# Patient Record
Sex: Female | Born: 1985
Health system: Southern US, Community
[De-identification: ages and names within clinical notes are randomized; demographics above are authoritative.]

## PROBLEM LIST (undated history)

## (undated) DIAGNOSIS — R7303 Prediabetes: Secondary | ICD-10-CM

## (undated) DIAGNOSIS — F419 Anxiety disorder, unspecified: Secondary | ICD-10-CM

## (undated) DIAGNOSIS — E119 Type 2 diabetes mellitus without complications: Secondary | ICD-10-CM

## (undated) HISTORY — PX: LEEP: SHX91

## (undated) HISTORY — DX: Prediabetes: R73.03

## (undated) HISTORY — PX: TONSILLECTOMY: SUR1361

---

## 2016-12-15 LAB — OB RESULTS CONSOLE HIV ANTIBODY (ROUTINE TESTING): HIV: NONREACTIVE

## 2016-12-15 LAB — OB RESULTS CONSOLE RPR: RPR: NONREACTIVE

## 2016-12-15 LAB — OB RESULTS CONSOLE ABO/RH: RH TYPE: NEGATIVE

## 2016-12-15 LAB — OB RESULTS CONSOLE ANTIBODY SCREEN: ANTIBODY SCREEN: NEGATIVE

## 2016-12-15 LAB — OB RESULTS CONSOLE GC/CHLAMYDIA
Chlamydia: NEGATIVE
GC PROBE AMP, GENITAL: NEGATIVE

## 2016-12-15 LAB — OB RESULTS CONSOLE RUBELLA ANTIBODY, IGM: Rubella: IMMUNE

## 2016-12-15 LAB — OB RESULTS CONSOLE HEPATITIS B SURFACE ANTIGEN: Hepatitis B Surface Ag: NEGATIVE

## 2017-05-21 ENCOUNTER — Other Ambulatory Visit (HOSPITAL_COMMUNITY): Payer: Self-pay

## 2017-05-21 DIAGNOSIS — Z3689 Encounter for other specified antenatal screening: Secondary | ICD-10-CM

## 2017-05-26 ENCOUNTER — Encounter (HOSPITAL_COMMUNITY): Payer: Self-pay | Admitting: *Deleted

## 2017-05-28 ENCOUNTER — Ambulatory Visit (HOSPITAL_COMMUNITY)
Admission: RE | Admit: 2017-05-28 | Discharge: 2017-05-28 | Disposition: A | Discharge status: Home / Self Care | Payer: 59 | Source: Ambulatory Visit

## 2017-05-28 DIAGNOSIS — Z3689 Encounter for other specified antenatal screening: Secondary | ICD-10-CM | POA: Diagnosis present

## 2017-05-28 DIAGNOSIS — O99213 Obesity complicating pregnancy, third trimester: Secondary | ICD-10-CM | POA: Insufficient documentation

## 2017-05-28 DIAGNOSIS — O3440 Maternal care for other abnormalities of cervix, unspecified trimester: Secondary | ICD-10-CM | POA: Insufficient documentation

## 2017-05-28 DIAGNOSIS — Z3A33 33 weeks gestation of pregnancy: Secondary | ICD-10-CM | POA: Insufficient documentation

## 2017-06-04 ENCOUNTER — Encounter (HOSPITAL_COMMUNITY): Payer: Self-pay

## 2017-06-19 LAB — OB RESULTS CONSOLE GBS: STREP GROUP B AG: NEGATIVE

## 2017-06-22 ENCOUNTER — Inpatient Hospital Stay (HOSPITAL_COMMUNITY): Admission: AD | Admit: 2017-06-22 | Payer: Self-pay | Source: Ambulatory Visit | Admitting: Obstetrics and Gynecology

## 2017-07-23 ENCOUNTER — Other Ambulatory Visit: Payer: Self-pay | Admitting: Obstetrics and Gynecology

## 2017-07-24 ENCOUNTER — Inpatient Hospital Stay (HOSPITAL_COMMUNITY)
Admission: RE | Admit: 2017-07-24 | Discharge: 2017-07-26 | DRG: 765 | Disposition: A | Discharge status: Home / Self Care | Payer: 59 | Source: Ambulatory Visit | Attending: Obstetrics | Admitting: Obstetrics

## 2017-07-24 ENCOUNTER — Inpatient Hospital Stay (HOSPITAL_COMMUNITY): Payer: 59 | Admitting: Anesthesiology

## 2017-07-24 ENCOUNTER — Encounter (HOSPITAL_COMMUNITY): Payer: Self-pay

## 2017-07-24 ENCOUNTER — Encounter (HOSPITAL_COMMUNITY)
Admission: RE | Disposition: A | Discharge status: Home / Self Care | Payer: Self-pay | Source: Ambulatory Visit | Attending: Obstetrics

## 2017-07-24 DIAGNOSIS — Z3A41 41 weeks gestation of pregnancy: Secondary | ICD-10-CM | POA: Diagnosis not present

## 2017-07-24 DIAGNOSIS — Z87891 Personal history of nicotine dependence: Secondary | ICD-10-CM

## 2017-07-24 DIAGNOSIS — Z6841 Body Mass Index (BMI) 40.0 and over, adult: Secondary | ICD-10-CM

## 2017-07-24 DIAGNOSIS — O9962 Diseases of the digestive system complicating childbirth: Secondary | ICD-10-CM | POA: Diagnosis present

## 2017-07-24 DIAGNOSIS — O99214 Obesity complicating childbirth: Secondary | ICD-10-CM | POA: Diagnosis present

## 2017-07-24 DIAGNOSIS — K219 Gastro-esophageal reflux disease without esophagitis: Secondary | ICD-10-CM | POA: Diagnosis present

## 2017-07-24 DIAGNOSIS — O480 Post-term pregnancy: Secondary | ICD-10-CM | POA: Diagnosis present

## 2017-07-24 DIAGNOSIS — O26893 Other specified pregnancy related conditions, third trimester: Secondary | ICD-10-CM | POA: Diagnosis present

## 2017-07-24 DIAGNOSIS — O26899 Other specified pregnancy related conditions, unspecified trimester: Secondary | ICD-10-CM

## 2017-07-24 DIAGNOSIS — Z3490 Encounter for supervision of normal pregnancy, unspecified, unspecified trimester: Secondary | ICD-10-CM | POA: Diagnosis present

## 2017-07-24 DIAGNOSIS — Z98891 History of uterine scar from previous surgery: Secondary | ICD-10-CM

## 2017-07-24 DIAGNOSIS — O3663X0 Maternal care for excessive fetal growth, third trimester, not applicable or unspecified: Secondary | ICD-10-CM | POA: Diagnosis present

## 2017-07-24 DIAGNOSIS — O43123 Velamentous insertion of umbilical cord, third trimester: Secondary | ICD-10-CM | POA: Diagnosis present

## 2017-07-24 DIAGNOSIS — Z6791 Unspecified blood type, Rh negative: Secondary | ICD-10-CM

## 2017-07-24 LAB — CBC
HCT: 36.6 % (ref 36.0–46.0)
HEMOGLOBIN: 12.5 g/dL (ref 12.0–15.0)
MCH: 29.8 pg (ref 26.0–34.0)
MCHC: 34.2 g/dL (ref 30.0–36.0)
MCV: 87.1 fL (ref 78.0–100.0)
Platelets: 249 10*3/uL (ref 150–400)
RBC: 4.2 MIL/uL (ref 3.87–5.11)
RDW: 14 % (ref 11.5–15.5)
WBC: 17.9 10*3/uL — AB (ref 4.0–10.5)

## 2017-07-24 LAB — TYPE AND SCREEN
ABO/RH(D): O NEG
Antibody Screen: NEGATIVE

## 2017-07-24 LAB — ABO/RH: ABO/RH(D): O NEG

## 2017-07-24 LAB — RPR: RPR: NONREACTIVE

## 2017-07-24 SURGERY — Surgical Case
Anesthesia: Epidural

## 2017-07-24 MED ORDER — EPHEDRINE 5 MG/ML INJ: 10.0000 mg | INTRAVENOUS | Status: DC | PRN

## 2017-07-24 MED ORDER — LACTATED RINGERS IV SOLN
500.0000 mL | INTRAVENOUS | Status: DC | PRN
  Administered 2017-07-24: 300 mL via INTRAVENOUS

## 2017-07-24 MED ORDER — CEFAZOLIN SODIUM 10 G IJ SOLR
INTRAMUSCULAR | Status: AC
  Filled 2017-07-24: qty 3000

## 2017-07-24 MED ORDER — LACTATED RINGERS IV SOLN
INTRAVENOUS | Status: DC
  Administered 2017-07-24 (×3): via INTRAVENOUS

## 2017-07-24 MED ORDER — METOCLOPRAMIDE HCL 5 MG/ML IJ SOLN: 10.0000 mg | Freq: Once | INTRAMUSCULAR | Status: DC | PRN

## 2017-07-24 MED ORDER — SOD CITRATE-CITRIC ACID 500-334 MG/5ML PO SOLN
30.0000 mL | ORAL | Status: DC | PRN
  Administered 2017-07-24: 30 mL via ORAL
  Filled 2017-07-24: qty 15

## 2017-07-24 MED ORDER — BUPIVACAINE HCL (PF) 0.25 % IJ SOLN
INTRAMUSCULAR | Status: DC | PRN
  Administered 2017-07-24: 20 mL

## 2017-07-24 MED ORDER — STERILE WATER FOR IRRIGATION IR SOLN
Status: DC | PRN
  Administered 2017-07-24: 1000 mL

## 2017-07-24 MED ORDER — OXYTOCIN 40 UNITS IN LACTATED RINGERS INFUSION - SIMPLE MED
1.0000 m[IU]/min | INTRAVENOUS | Status: DC
  Administered 2017-07-24: 2 m[IU]/min via INTRAVENOUS

## 2017-07-24 MED ORDER — FENTANYL CITRATE (PF) 100 MCG/2ML IJ SOLN
25.0000 ug | INTRAMUSCULAR | Status: DC | PRN
  Administered 2017-07-24: 50 ug via INTRAVENOUS

## 2017-07-24 MED ORDER — DEXAMETHASONE SODIUM PHOSPHATE 10 MG/ML IJ SOLN
INTRAMUSCULAR | Status: DC | PRN
  Administered 2017-07-24: 10 mg via INTRAVENOUS

## 2017-07-24 MED ORDER — SODIUM CHLORIDE 0.9 % IJ SOLN
INTRAMUSCULAR | Status: AC
  Filled 2017-07-24: qty 20

## 2017-07-24 MED ORDER — FENTANYL CITRATE (PF) 100 MCG/2ML IJ SOLN
INTRAMUSCULAR | Status: AC
  Filled 2017-07-24: qty 2

## 2017-07-24 MED ORDER — OXYTOCIN 10 UNIT/ML IJ SOLN
INTRAVENOUS | Status: DC | PRN
  Administered 2017-07-24: 40 [IU] via INTRAVENOUS

## 2017-07-24 MED ORDER — LACTATED RINGERS IV SOLN: 500.0000 mL | Freq: Once | INTRAVENOUS | Status: DC

## 2017-07-24 MED ORDER — LIDOCAINE HCL (PF) 1 % IJ SOLN: 30.0000 mL | INTRAMUSCULAR | Status: DC | PRN

## 2017-07-24 MED ORDER — BUPIVACAINE HCL (PF) 0.25 % IJ SOLN
INTRAMUSCULAR | Status: AC
  Filled 2017-07-24: qty 30

## 2017-07-24 MED ORDER — SODIUM BICARBONATE 8.4 % IV SOLN
INTRAVENOUS | Status: DC | PRN
  Administered 2017-07-24 (×3): 5 mL via EPIDURAL

## 2017-07-24 MED ORDER — OXYTOCIN 10 UNIT/ML IJ SOLN
INTRAMUSCULAR | Status: AC
  Filled 2017-07-24: qty 4

## 2017-07-24 MED ORDER — SCOPOLAMINE 1 MG/3DAYS TD PT72
MEDICATED_PATCH | TRANSDERMAL | Status: AC
  Filled 2017-07-24: qty 1

## 2017-07-24 MED ORDER — PHENYLEPHRINE 40 MCG/ML (10ML) SYRINGE FOR IV PUSH (FOR BLOOD PRESSURE SUPPORT)
80.0000 ug | PREFILLED_SYRINGE | INTRAVENOUS | Status: DC | PRN
  Filled 2017-07-24: qty 10

## 2017-07-24 MED ORDER — TERBUTALINE SULFATE 1 MG/ML IJ SOLN: 0.2500 mg | Freq: Once | INTRAMUSCULAR | Status: DC | PRN

## 2017-07-24 MED ORDER — ACETAMINOPHEN 325 MG PO TABS
650.0000 mg | ORAL_TABLET | ORAL | Status: DC | PRN
Start: 2017-07-24 — End: 2017-07-25

## 2017-07-24 MED ORDER — OXYTOCIN 40 UNITS IN LACTATED RINGERS INFUSION - SIMPLE MED
2.5000 [IU]/h | INTRAVENOUS | Status: DC
  Filled 2017-07-24: qty 1000

## 2017-07-24 MED ORDER — ONDANSETRON HCL 4 MG/2ML IJ SOLN: 4.0000 mg | Freq: Four times a day (QID) | INTRAMUSCULAR | Status: DC | PRN

## 2017-07-24 MED ORDER — MORPHINE SULFATE (PF) 0.5 MG/ML IJ SOLN
INTRAMUSCULAR | Status: DC | PRN
  Administered 2017-07-24: 4 mg via EPIDURAL

## 2017-07-24 MED ORDER — DEXAMETHASONE SODIUM PHOSPHATE 10 MG/ML IJ SOLN
INTRAMUSCULAR | Status: AC
  Filled 2017-07-24: qty 1

## 2017-07-24 MED ORDER — DEXTROSE 5 % IV SOLN
INTRAVENOUS | Status: DC | PRN
  Administered 2017-07-24: 3 g via INTRAVENOUS

## 2017-07-24 MED ORDER — ONDANSETRON HCL 4 MG/2ML IJ SOLN
INTRAMUSCULAR | Status: AC
  Filled 2017-07-24: qty 2

## 2017-07-24 MED ORDER — MORPHINE SULFATE (PF) 0.5 MG/ML IJ SOLN
INTRAMUSCULAR | Status: AC
  Filled 2017-07-24: qty 10

## 2017-07-24 MED ORDER — LACTATED RINGERS IV SOLN
INTRAVENOUS | Status: DC | PRN
  Administered 2017-07-24: 22:00:00 via INTRAVENOUS

## 2017-07-24 MED ORDER — MEPERIDINE HCL 25 MG/ML IJ SOLN
INTRAMUSCULAR | Status: AC
  Filled 2017-07-24: qty 1

## 2017-07-24 MED ORDER — FENTANYL 2.5 MCG/ML BUPIVACAINE 1/10 % EPIDURAL INFUSION (WH - ANES)
10.0000 mL/h | INTRAMUSCULAR | Status: DC | PRN
  Administered 2017-07-24: 12 mL/h via EPIDURAL
  Filled 2017-07-24: qty 100

## 2017-07-24 MED ORDER — ONDANSETRON HCL 4 MG/2ML IJ SOLN
INTRAMUSCULAR | Status: DC | PRN
  Administered 2017-07-24: 4 mg via INTRAVENOUS

## 2017-07-24 MED ORDER — LACTATED RINGERS IV SOLN: INTRAVENOUS | Status: DC | PRN

## 2017-07-24 MED ORDER — FENTANYL CITRATE (PF) 100 MCG/2ML IJ SOLN
100.0000 ug | INTRAMUSCULAR | Status: DC | PRN
  Administered 2017-07-24 (×2): 100 ug via INTRAVENOUS
  Filled 2017-07-24: qty 2

## 2017-07-24 MED ORDER — BUPIVACAINE HCL (PF) 0.25 % IJ SOLN
INTRAMUSCULAR | Status: AC
  Filled 2017-07-24: qty 10

## 2017-07-24 MED ORDER — LACTATED RINGERS IV SOLN
INTRAVENOUS | Status: DC
  Administered 2017-07-24: 20:00:00 via INTRAUTERINE

## 2017-07-24 MED ORDER — MEPERIDINE HCL 25 MG/ML IJ SOLN
INTRAMUSCULAR | Status: DC | PRN
  Administered 2017-07-24 (×2): 12.5 mg via INTRAVENOUS

## 2017-07-24 MED ORDER — OXYTOCIN BOLUS FROM INFUSION: 500.0000 mL | Freq: Once | INTRAVENOUS | Status: DC

## 2017-07-24 MED ORDER — PHENYLEPHRINE 40 MCG/ML (10ML) SYRINGE FOR IV PUSH (FOR BLOOD PRESSURE SUPPORT): 80.0000 ug | PREFILLED_SYRINGE | INTRAVENOUS | Status: DC | PRN

## 2017-07-24 MED ORDER — SCOPOLAMINE 1 MG/3DAYS TD PT72
MEDICATED_PATCH | TRANSDERMAL | Status: DC | PRN
  Administered 2017-07-24: 1 via TRANSDERMAL

## 2017-07-24 MED ORDER — DIPHENHYDRAMINE HCL 50 MG/ML IJ SOLN: 12.5000 mg | INTRAMUSCULAR | Status: DC | PRN

## 2017-07-24 MED ORDER — MISOPROSTOL 25 MCG QUARTER TABLET
25.0000 ug | ORAL_TABLET | ORAL | Status: DC | PRN
  Administered 2017-07-24 (×2): 25 ug via VAGINAL
  Filled 2017-07-24 (×2): qty 1

## 2017-07-24 SURGICAL SUPPLY — 36 items
CHLORAPREP W/TINT 26ML (MISCELLANEOUS) ×2 IMPLANT
CLAMP CORD UMBIL (MISCELLANEOUS) IMPLANT
CLOTH BEACON ORANGE TIMEOUT ST (SAFETY) ×2 IMPLANT
CONTAINER PREFILL 10% NBF 15ML (MISCELLANEOUS) IMPLANT
DERMABOND ADVANCED (GAUZE/BANDAGES/DRESSINGS) ×1
DERMABOND ADVANCED .7 DNX12 (GAUZE/BANDAGES/DRESSINGS) ×1 IMPLANT
DRSG OPSITE POSTOP 4X10 (GAUZE/BANDAGES/DRESSINGS) ×2 IMPLANT
ELECT REM PT RETURN 9FT ADLT (ELECTROSURGICAL) ×2
ELECTRODE REM PT RTRN 9FT ADLT (ELECTROSURGICAL) ×1 IMPLANT
EXTRACTOR VACUUM M CUP 4 TUBE (SUCTIONS) IMPLANT
GLOVE BIO SURGEON STRL SZ7.5 (GLOVE) ×2 IMPLANT
GLOVE BIOGEL PI IND STRL 7.0 (GLOVE) ×1 IMPLANT
GLOVE BIOGEL PI INDICATOR 7.0 (GLOVE) ×1
GOWN STRL REUS W/TWL LRG LVL3 (GOWN DISPOSABLE) ×4 IMPLANT
HOVERMATT SINGLE USE (MISCELLANEOUS) ×2 IMPLANT
KIT ABG SYR 3ML LUER SLIP (SYRINGE) IMPLANT
NEEDLE HYPO 22GX1.5 SAFETY (NEEDLE) ×2 IMPLANT
NEEDLE HYPO 25X5/8 SAFETYGLIDE (NEEDLE) IMPLANT
NEEDLE SPNL 20GX3.5 QUINCKE YW (NEEDLE) IMPLANT
NS IRRIG 1000ML POUR BTL (IV SOLUTION) ×2 IMPLANT
PACK C SECTION WH (CUSTOM PROCEDURE TRAY) ×2 IMPLANT
PENCIL SMOKE EVAC W/HOLSTER (ELECTROSURGICAL) ×2 IMPLANT
STRIP CLOSURE SKIN 1X5 (GAUZE/BANDAGES/DRESSINGS) ×2 IMPLANT
SUT MNCRL 0 VIOLET CTX 36 (SUTURE) ×2 IMPLANT
SUT MNCRL AB 3-0 PS2 27 (SUTURE) ×2 IMPLANT
SUT MON AB 2-0 CT1 27 (SUTURE) ×2 IMPLANT
SUT MON AB-0 CT1 36 (SUTURE) ×2 IMPLANT
SUT MONOCRYL 0 CTX 36 (SUTURE) ×2
SUT PLAIN 0 NONE (SUTURE) ×2 IMPLANT
SUT PLAIN 2 0 (SUTURE)
SUT PLAIN 2 0 XLH (SUTURE) IMPLANT
SUT PLAIN ABS 2-0 CT1 27XMFL (SUTURE) IMPLANT
SYR 20CC LL (SYRINGE) IMPLANT
SYR CONTROL 10ML LL (SYRINGE) ×2 IMPLANT
TOWEL OR 17X24 6PK STRL BLUE (TOWEL DISPOSABLE) ×2 IMPLANT
TRAY FOLEY BAG SILVER LF 14FR (SET/KITS/TRAYS/PACK) ×2 IMPLANT

## 2017-07-24 NOTE — Transfer of Care (Signed)
Immediate Anesthesia Transfer of Care Note  Patient: Michelle Lozano  Procedure(s) Performed: Procedure(s): CESAREAN SECTION (N/A)  Patient Location: PACU  Anesthesia Type:Epidural  Level of Consciousness: awake, alert  and oriented  Airway & Oxygen Therapy: Patient Spontanous Breathing  Post-op Assessment: Report given to RN and Post -op Vital signs reviewed and stable  Post vital signs: Reviewed and stable  Last Vitals:  Vitals:   07/24/17 2031 07/24/17 2102  BP: 116/70 (!) 104/59  Pulse: 79 84  Resp:    Temp:    SpO2:      Last Pain:  Vitals:   07/24/17 1939  TempSrc: Oral  PainSc:          Complications: No apparent anesthesia complications

## 2017-07-24 NOTE — Anesthesia Pain Management Evaluation Note (Signed)
  CRNA Pain Management Visit Note  Patient: Michelle Lozano, 31 y.o., female  "Hello I am a member of the anesthesia team at [facility]. We have an anesthesia team available at all times to provide care throughout the hospital, including epidural management and anesthesia for C-section. I don't know your plan for the delivery whether it a natural birth, water birth, IV sedation, nitrous supplementation, doula or epidural, but we want to meet your pain goals."   1.Was your pain managed to your expectations on prior hospitalizations?   No prior hospitalizations  2.What is your expectation for pain management during this hospitalization?     Epidural  3.How can we help you reach that goal? Support prn  Record the patient's initial score and the patient's pain goal.   Pain: 1  Pain Goal: 5 [facility] wants you to be able to say your pain was always managed very well.  [facility] HospitalWRINKLE,Kagan Mutchler 07/24/2017

## 2017-07-24 NOTE — Op Note (Signed)
Cesarean Section Procedure Note  Indications: failure to progress: arrest of dilation and non-reassuring fetal status  Pre-operative Diagnosis: 40 week 2 day pregnancy.  Post-operative Diagnosis: same  Surgeon: Lenoard AdenAAVON,Eldrige Pitkin J   Assistants: Idelle JoSigmon, CNM  Anesthesia: Epidural anesthesia and Local anesthesia 0.25.% bupivacaine  ASA Class: 2  Procedure Details  The patient was seen in the Holding Room. The risks, benefits, complications, treatment options, and expected outcomes were discussed with the patient.  The patient concurred with the proposed plan, giving informed consent. The risks of anesthesia, infection, bleeding and possible injury to other organs discussed. Injury to bowel, bladder, or ureter with possible need for repair discussed. Possible need for transfusion with secondary risks of hepatitis or HIV acquisition discussed. Post operative complications to include but not limited to DVT, PE and Pneumonia noted. The site of surgery properly noted/marked. The patient was taken to Operating Room # 9, identified as Michelle Lozano and the procedure verified as C-Section Delivery. A Time Out was held and the above information confirmed.  After induction of anesthesia, the patient was draped and prepped in the usual sterile manner. A Pfannenstiel incision was made and carried down through the subcutaneous tissue to the fascia. Fascial incision was made and extended transversely using Mayo scissors. The fascia was separated from the underlying rectus tissue superiorly and inferiorly. The peritoneum was identified and entered. Peritoneal incision was extended longitudinally. The utero-vesical peritoneal reflection was incised transversely and the bladder flap was bluntly freed from the lower uterine segment. A low transverse uterine incision(Kerr hysterotomy) was made. Delivered from OA presentation was a  female with Apgar scores of 8 at one minute and 9 at five minutes. Bulb suctioning gently  performed. Neonatal team in attendance.After the umbilical cord was clamped and cut cord blood was obtained for evaluation. The placenta was removed intact and appeared normal. The uterus was curetted with a dry lap pack. Good hemostasis was noted.The uterine outline, tubes and ovaries appeared normal. The uterine incision was closed with running locked sutures of 0 Monocryl x 2 layers. Hemostasis was observed. Lavage was carried out until clear.The parietal peritoneum was closed with a running 2-0 Monocryl suture. The fascia was then reapproximated with running sutures of 0 Monocryl. The skin was reapproximated with 3-0 mnocryl after [place] closure with 2-0 plain.  Instrument, sponge, and needle counts were correct prior the abdominal closure and at the conclusion of the case.   Findings: FTLM, asynclitic  Estimated Blood Loss:  600         Drains: foley                 Specimens: placenta                 Complications:  None; patient tolerated the procedure well.         Disposition: PACU - hemodynamically stable.         Condition: stable  Attending Attestation: I performed the procedure.    r

## 2017-07-24 NOTE — Progress Notes (Signed)
S:  Pt. Feeling more comfortable with epidural. Discussed AROM and IUPC placement and pt. Agrees  O:  VS: Blood pressure 123/71, pulse 75, temperature 97.9 F (36.6 C), temperature source Oral, resp. rate 18, height 5\' 7"  (1.702 m), weight 127.9 kg (282 lb), last menstrual period 10/09/2016, SpO2 100 %.        FHR : baseline 145 bpm / variability moderate / accelerations + / no decelerations        Toco: contractions every 2-3 minutes        Cervix : 6cm/80%/-2/vtx        Membranes: AROM - large amount of clear/pink-tinged fluid         IUPC placed  A: Active labor     FHR category 2     GBS negative     Suspected LGA  P: Continue to titrate Pitocin to achieve MVUs of 200-250      Encouraged lateral positioning with peanut ball      Anticipate NSVD  Dr. Billy Coastaavon updated with plan - agrees   Carlean JewsMeredith Ahad Colarusso, MSN, CNM [facility]

## 2017-07-24 NOTE — Progress Notes (Signed)
S:  Pt. Requesting pain medication - states nitrous oxide is not working well.        May consider epidural after foley bulb is out  O:  VS: Blood pressure 138/77, pulse 67, temperature 97.9 F (36.6 C), temperature source Oral, resp. rate 20, height 5\' 7"  (1.702 m), weight 127.9 kg (282 lb), last menstrual period 10/09/2016.        FHR : baseline 135 bpm / variability moderate / accelerations + / no decelerations        Toco: contractions every 1.5-3 minutes / mild         Cervix : 3.5cm/90%/-2         Bedside sono confirmed vtx for Pitocin         Membranes: Intact  A: Latent labor     FHR category 1     GBS negative   P: Fentanyl 100mcg IVP every 2hrs PRN for pain      Continue Foley bulb with traction       Begin Pitocin IV at 2 milliunits and increase by 2 milliunits       Reassess in 1-2 hours    Michelle JewsMeredith Sigmon, MSN, CNM [facility]

## 2017-07-24 NOTE — Anesthesia Procedure Notes (Signed)
Epidural Patient location during procedure: OB Start time: 07/24/2017 4:04 PM  Staffing Anesthesiologist: Odette FractionHARKINS, Boyde Grieco Performed: anesthesiologist   Preanesthetic Checklist Completed: patient identified, site marked, surgical consent, pre-op evaluation, timeout performed, IV checked, risks and benefits discussed and monitors and equipment checked  Epidural Patient position: sitting Prep: ChloraPrep Patient monitoring: heart rate, continuous pulse ox and blood pressure Location: L3-L4 Injection technique: LOR saline and LOR air  Needle:  Needle type: Tuohy  Needle gauge: 17 G Needle length: 9 cm and 9 Needle insertion depth: 8 cm Catheter type: closed end flexible Catheter size: 20 Guage Catheter at skin depth: 12 cm Test dose: negative  Assessment Events: blood aspirated, injection not painful, no injection resistance, negative IV test and no paresthesia  Additional Notes Patient identified. Risks/Benefits/Options discussed with patient including but not limited to bleeding, infection, nerve damage, paralysis, failed block, incomplete pain control, headache, blood pressure changes, nausea, vomiting, reactions to medication both or allergic, itching and postpartum back pain. Confirmed with bedside nurse the patient's most recent platelet count. Confirmed with patient that they are not currently taking any anticoagulation, have any bleeding history or any family history of bleeding disorders. Patient expressed understanding and wished to proceed. All questions were answered. Sterile technique was used throughout the entire procedure. Please see nursing notes for vital signs. Heme aspirated after LOR and catheter threading. Catheter withdrawn and second attempt successful without paresthesia, heme, or CSF. Test dose was given through epidural needle and negative prior to continuing to dose epidural or start infusion.    Patient was given instructions on fall risk and not to get out of  bed. All questions and concerns addressed with instructions to call with any issues.

## 2017-07-24 NOTE — Progress Notes (Addendum)
S:  Amnioinfusion has been going since 1930. Pt. Has tried alternating exaggerated sims, high fowlers position without relief of variable decelerations.  Baby now losing some variability. Cervical exam unchanged since 1700.  Pitocin off now, and IV fluid bolus infusing.  O:  VS: Blood pressure 116/70, pulse 79, temperature 98.2 F (36.8 C), temperature source Oral, resp. rate 17, height 5\' 7"  (1.702 m), weight 127.9 kg (282 lb), last menstrual period 10/09/2016, SpO2 100 %.        FHR : baseline 135 bpm / variability minimal / accelerations no / variable decelerations        Toco: contractions every 1-3 minutes / strong / MVU - adequate >200        Cervix : Dilation: 6 Effacement (%): 90 Cervical Position: Middle Station: -1 Presentation: Vertex Exam by:: Carlean JewsMeredith Zailyn Thoennes, CNM        Membranes: clear fluid  A: Protracted active labor labor     FHR category 3  P: Discussed with Dr. Billy Coastaavon and he reviewed fetal monitoring strip - decision to proceed with primary low transverse cesarean section for [facility] and arrest of dilation. Pt. And family agree. Dr. Billy Coastaavon notified OR.  I discussed risks, benefits of procedure.  Dr. Billy Coastaavon on the way.     Carlean JewsMeredith Christalyn Goertz, MSN, CNM [facility]

## 2017-07-24 NOTE — Progress Notes (Signed)
Patient to come off monitor and the be saline locked for shower per midwife. Restart pitocin and continuous monitoring after shower.

## 2017-07-24 NOTE — Progress Notes (Addendum)
S:  Foley bulb out at 1300. Pt. Only on 2 milliunits of Pitocin, and pt. States she has not had any painful contractions since foley came out.  Pt. Desires to shower.   O:  VS: Blood pressure 134/78, pulse 72, temperature 97.9 F (36.6 C), temperature source Oral, resp. rate 18, height 5\' 7"  (1.702 m), weight 127.9 kg (282 lb), last menstrual period 10/09/2016.        FHR : baseline 140 bpm / variability moderate / accelerations + / no decelerations        Toco: contractions every 1.5-3.5 minutes / mild- moderate        Cervix : Dilation: 4.5 Effacement (%): 70 Cervical Position: Middle Station: -2 Presentation: Vertex Exam by:: M. Dhiya Smits        Membranes: Intact  A: Latent labor     FHR category 1     GBS Negative     Suspect LGA  P: Okay to saline lock pt. For shower      Resume Pitocin and continuous monitoring after shower     Start Pitocin at 2 milliunits and increase by 2 milliunits until adequate contraction pattern     Fentanyl IVP PRN or epidural when ready     AROM when appropriate    Reassess in 2 hours    Carlean JewsMeredith Blue Ruggerio, MSN, CNM [facility]

## 2017-07-24 NOTE — Anesthesia Preprocedure Evaluation (Addendum)
Anesthesia Evaluation  Patient identified by MRN, date of birth, ID band Patient awake    Reviewed: Allergy & Precautions, H&P , NPO status , Patient's Chart, lab work & pertinent test results  History of Anesthesia Complications Negative for: history of anesthetic complications  Airway Mallampati: III  TM Distance: >3 FB Neck ROM: Full    Dental no notable dental hx. (+) Teeth Intact   Pulmonary neg pulmonary ROS, former smoker,    Pulmonary exam normal breath sounds clear to auscultation       Cardiovascular negative cardio ROS Normal cardiovascular exam Rhythm:Regular Rate:Normal     Neuro/Psych negative neurological ROS  negative psych ROS   GI/Hepatic negative GI ROS, Neg liver ROS, GERD  Medicated and Controlled,  Endo/Other  Morbid obesity  Renal/GU negative Renal ROS  negative genitourinary   Musculoskeletal negative musculoskeletal ROS (+)   Abdominal   Peds  Hematology negative hematology ROS (+)   Anesthesia Other Findings   Reproductive/Obstetrics (+) Pregnancy                           Anesthesia Physical Anesthesia Plan  ASA: III and emergent  Anesthesia Plan: Epidural   Post-op Pain Management:    Induction:   PONV Risk Score and Plan: 4 or greater and Ondansetron, Dexamethasone, Midazolam, Scopolamine patch - Pre-op, Propofol infusion and Treatment may vary due to age or medical condition  Airway Management Planned: Natural Airway and Nasal Cannula  Additional Equipment:   Intra-op Plan:   Post-operative Plan:   Informed Consent: I have reviewed the patients History and Physical, chart, labs and discussed the procedure including the risks, benefits and alternatives for the proposed anesthesia with the patient or authorized representative who has indicated his/her understanding and acceptance.     Plan Discussed with: CRNA, Anesthesiologist and  Surgeon  Anesthesia Plan Comments: (C/Section for failure to progress. Will use Epidural for C/section. M. Malen GauzeFoster, MD)       Anesthesia Quick Evaluation

## 2017-07-24 NOTE — Progress Notes (Signed)
S:  Pt. Comfortable with epidural.  Reports she slept for 1.5 hours. Currently in left exaggerated sims. Current variable decelerations noted  O:  VS: Blood pressure 109/62, pulse 71, temperature 98.2 F (36.8 C), temperature source Oral, resp. rate 17, height 5\' 7"  (1.702 m), weight 127.9 kg (282 lb), last menstrual period 10/09/2016, SpO2 100 %.        FHR : baseline 130 bpm / variability moderate / accelerations + / variable decelerations / +scalp stimulation        Toco: contractions every 1.5-3.5 minutes / strong / MVU - adequate >200        Cervix : Dilation: 6 Effacement (%): 90 Cervical Position: Middle Station: -1 Presentation: Vertex Exam by:: Carlean JewsMeredith Rose-Marie Hickling, CNM        Membranes: clear fluid  A: Protracted active labor     FHR category 2  P: Initiate amnioinfusion 300mL bolus, then 15950mL/hr      High fowlers position      Continue alternating positions      Reassess in 1 hour     Cautious for SVD  Dr. Billy Coastaavon notified of status    Carlean JewsMeredith Obdulio Mash, MSN, CNM [facility]

## 2017-07-24 NOTE — Progress Notes (Signed)
S:  Pt. Feeling stronger contractions. States second dose of Fentanyl did not work as well. Considering an epidural soon.   O:  VS: Blood pressure 124/71, pulse 75, temperature 97.9 F (36.6 C), temperature source Oral, resp. rate 18, height 5\' 7"  (1.702 m), weight 127.9 kg (282 lb), last menstrual period 10/09/2016, SpO2 100 %.        FHR : baseline 145 bpm / variability moderate / accelerations + / no decelerations        Toco: contractions every 2-3 minutes / moderate        Cervix : deferred        Membranes: Intact  A: Latent labor     FHR category 1     GBS Negative     Suspect LGA  P: Epidural now      Will plan for AROM and IUPC after comfortable      Reassess in 1 hour      Anticipate NSVD   Carlean JewsMeredith Sigmon, MSN, CNM [facility]

## 2017-07-24 NOTE — Progress Notes (Signed)
S:  Pt. Has received 2 doses of vaginal cytotec.  States she is feeling mild cramping.  Slept some overnight. Discussed placing foley bulb and pt. Agrees with plan. Plan for Nitrous oxide PRN during procedure.   O:  VS: Blood pressure (!) 142/83, pulse 70, temperature 97.9 F (36.6 C), temperature source Oral, resp. rate 18, height 5\' 7"  (1.702 m), weight 127.9 kg (282 lb), last menstrual period 10/09/2016.        FHR : baseline 135 bpm / variability moderate / accelerations + / no decelerations        Toco: contractions every 1.5 -4minutes / mild         Cervix : Dilation: 1.5 Effacement (%): 60 Cervical Position: Middle Station: -2 Presentation: Vertex Exam by:: M Sigmon CNM        Membranes: Intact   A: Latent labor     FHR category 1     GBS Negative     Hx. Of LEEP  P: Foley bulb successfully placed and pain controlled with nitrous oxide     May eat light laboring diet      Reassess for Pitocin vs. Cytotec in 1 hour    Carlean JewsMeredith Sigmon, MSN, CNM [facility]

## 2017-07-24 NOTE — H&P (Signed)
OB ADMISSION/ HISTORY & PHYSICAL:  Admission Date: 07/24/2017 12:15 AM  Admit Diagnosis: IOL at 41 weeks for postdates and suspected LGA  Michelle Lozano is a 31 y.o. female G1P0 at 641 weeks presenting for induction of labor for postdates and suspected LGA.  Postdates ultrasound in office on 9/6 showed EFW of 9#5oz, BPP 8/8, AFI 20.9.    Prenatal History: G1P0   EDC : 07/16/2017, by Last Menstrual Period  [facility]  Primary Ob Provider: CNM Management/ T. Bailey Prenatal course complicated by:  - Hx. Of LEEP - Obesity - Hx. Of PCOS - has been off Metformin during pregnancy - RH Negative: s/p Rhogam at 28 weeks  - Possible shingles outbreak in third trimester/Varicella PCR neg - treated with Acyclovir for 7 days  Prenatal Labs: ABO, Rh: --/--/O NEG (09/07 0049) Antibody: NEG (09/07 0046) Rubella: Immune (01/29 0000)  RPR: Nonreactive (01/29 0000)  HBsAg: Negative (01/29 0000)  HIV: Non-reactive (01/29 0000)  GTT: 87 GBS: Negative (08/03 0000)  Ultrascreen/NT: normal/negative CUS at 20 weeks WNL: anterior placenta, female, suboptimal views of heart and face Detailed sono with MFM at 33 weeks for additional views WNL - limited views of face and cord insertion Tdap up to date   Medical / Surgical History :  Past medical history: History reviewed. No pertinent past medical history.   Past surgical history:  Past Surgical History:  Procedure Laterality Date  . LEEP    . TONSILLECTOMY      Family History: History reviewed. No pertinent family history.   Social History:  reports that she quit smoking about 10 years ago. Her smoking use included Cigarettes. She has never used smokeless tobacco. She reports that she does not drink alcohol or use drugs.   Allergies: Patient has no known allergies.    Current Medications at time of admission:  Prior to Admission medications   Medication Sig Start Date End Date Taking? Authorizing Provider   loratadine (CLARITIN) 10 MG tablet Take 10 mg by mouth daily.   Yes [provider]  magnesium 30 MG tablet Take 30 mg by mouth 2 (two) times daily.   Yes [provider]  Prenatal Vit-Fe Fumarate-FA (PRENATAL MULTIVITAMIN) TABS tablet Take 1 tablet by mouth daily at 12 noon.   Yes [provider]     Review of Systems: Active FM Occasional BH ctxs No LOF  / SROM  No bloody show   Physical Exam:  VS: Blood pressure 118/74, pulse 86, temperature 97.9 F (36.6 C), temperature source Oral, resp. rate 16, height 5\' 7"  (1.702 m), weight 127.9 kg (282 lb), last menstrual period 10/09/2016.  General: alert and oriented, appears mildly anxious Heart: RRR Lungs: Clear lung fields Abdomen: Gravid, soft and non-tender, non-distended, obese / uterus: gravid, non-tender Extremities: trace BLE edema  Genitalia / VE: Dilation: 1 Effacement (%): Thick Station: -2, -3 Exam by:: L.Mears,Rn  FHR: baseline rate 130 bpm / variability moderate / accelerations + / no decelerations TOCO: none  Assessment: [redacted] weeks gestation Induction stage of labor for postdates and suspect LGA FHR category 1 GBS Negative Hx. Of LEEP  Plan:  [facility]   - Routine labor and delivery orders   - Pain management: nitrous oxide PRN, epidural in active labor 2. GBS Negative   - No prophylaxis indicated 3. Postpartum:   - Breast   - Contraception: unsure 4. Anticipate MOD: NSVD   - EFW by sono: 9#5oz  Dr. Ernestina Penna notified of admission / plan of care  Carlean Jews, MSN, [facility]

## 2017-07-25 ENCOUNTER — Encounter (HOSPITAL_COMMUNITY): Payer: Self-pay

## 2017-07-25 DIAGNOSIS — O26899 Other specified pregnancy related conditions, unspecified trimester: Secondary | ICD-10-CM

## 2017-07-25 DIAGNOSIS — Z6791 Unspecified blood type, Rh negative: Secondary | ICD-10-CM

## 2017-07-25 LAB — CBC
HEMATOCRIT: 34.2 % — AB (ref 36.0–46.0)
HEMOGLOBIN: 11.5 g/dL — AB (ref 12.0–15.0)
MCH: 29.7 pg (ref 26.0–34.0)
MCHC: 33.6 g/dL (ref 30.0–36.0)
MCV: 88.4 fL (ref 78.0–100.0)
Platelets: 230 10*3/uL (ref 150–400)
RBC: 3.87 MIL/uL (ref 3.87–5.11)
RDW: 13.8 % (ref 11.5–15.5)
WBC: 23.5 10*3/uL — AB (ref 4.0–10.5)

## 2017-07-25 MED ORDER — DIPHENHYDRAMINE HCL 25 MG PO CAPS
25.0000 mg | ORAL_CAPSULE | Freq: Four times a day (QID) | ORAL | Status: DC | PRN
  Administered 2017-07-25 (×2): 25 mg via ORAL
  Filled 2017-07-25 (×2): qty 1

## 2017-07-25 MED ORDER — PRENATAL MULTIVITAMIN CH
1.0000 | ORAL_TABLET | Freq: Every day | ORAL | Status: DC
  Administered 2017-07-25 – 2017-07-26 (×2): 1 via ORAL
  Filled 2017-07-25 (×2): qty 1

## 2017-07-25 MED ORDER — METHYLERGONOVINE MALEATE 0.2 MG PO TABS: 0.2000 mg | ORAL_TABLET | ORAL | Status: DC | PRN

## 2017-07-25 MED ORDER — ACETAMINOPHEN 325 MG PO TABS
650.0000 mg | ORAL_TABLET | ORAL | Status: DC | PRN
  Administered 2017-07-25: 650 mg via ORAL
  Filled 2017-07-25: qty 2

## 2017-07-25 MED ORDER — SIMETHICONE 80 MG PO CHEW
80.0000 mg | CHEWABLE_TABLET | Freq: Three times a day (TID) | ORAL | Status: DC
  Administered 2017-07-25 – 2017-07-26 (×4): 80 mg via ORAL
  Filled 2017-07-25 (×5): qty 1

## 2017-07-25 MED ORDER — OXYCODONE-ACETAMINOPHEN 5-325 MG PO TABS
2.0000 | ORAL_TABLET | ORAL | Status: DC | PRN
  Administered 2017-07-26 (×2): 2 via ORAL
  Filled 2017-07-25 (×2): qty 2

## 2017-07-25 MED ORDER — COCONUT OIL OIL: 1.0000 "application " | TOPICAL_OIL | Status: DC | PRN

## 2017-07-25 MED ORDER — METHYLERGONOVINE MALEATE 0.2 MG/ML IJ SOLN: 0.2000 mg | INTRAMUSCULAR | Status: DC | PRN

## 2017-07-25 MED ORDER — SENNOSIDES-DOCUSATE SODIUM 8.6-50 MG PO TABS
2.0000 | ORAL_TABLET | ORAL | Status: DC
  Administered 2017-07-26: 2 via ORAL
  Filled 2017-07-25 (×2): qty 2

## 2017-07-25 MED ORDER — MENTHOL 3 MG MT LOZG: 1.0000 | LOZENGE | OROMUCOSAL | Status: DC | PRN

## 2017-07-25 MED ORDER — OXYCODONE-ACETAMINOPHEN 5-325 MG PO TABS
1.0000 | ORAL_TABLET | ORAL | Status: DC | PRN
  Administered 2017-07-25 – 2017-07-26 (×2): 1 via ORAL
  Filled 2017-07-25 (×2): qty 1

## 2017-07-25 MED ORDER — WITCH HAZEL-GLYCERIN EX PADS: 1.0000 "application " | MEDICATED_PAD | CUTANEOUS | Status: DC | PRN

## 2017-07-25 MED ORDER — DIBUCAINE 1 % RE OINT
1.0000 | TOPICAL_OINTMENT | RECTAL | Status: DC | PRN
Start: 2017-07-25 — End: 2017-07-26

## 2017-07-25 MED ORDER — ZOLPIDEM TARTRATE 5 MG PO TABS: 5.0000 mg | ORAL_TABLET | Freq: Every evening | ORAL | Status: DC | PRN

## 2017-07-25 MED ORDER — IBUPROFEN 600 MG PO TABS
600.0000 mg | ORAL_TABLET | Freq: Four times a day (QID) | ORAL | Status: DC
  Administered 2017-07-25 – 2017-07-26 (×6): 600 mg via ORAL
  Filled 2017-07-25 (×6): qty 1

## 2017-07-25 MED ORDER — LACTATED RINGERS IV SOLN
INTRAVENOUS | Status: DC
  Administered 2017-07-25: 02:00:00 via INTRAVENOUS

## 2017-07-25 MED ORDER — SIMETHICONE 80 MG PO CHEW
80.0000 mg | CHEWABLE_TABLET | ORAL | Status: DC | PRN
  Administered 2017-07-25: 80 mg via ORAL

## 2017-07-25 MED ORDER — SIMETHICONE 80 MG PO CHEW
80.0000 mg | CHEWABLE_TABLET | ORAL | Status: DC
  Administered 2017-07-26: 80 mg via ORAL
  Filled 2017-07-25 (×2): qty 1

## 2017-07-25 MED ORDER — TETANUS-DIPHTH-ACELL PERTUSSIS 5-2.5-18.5 LF-MCG/0.5 IM SUSP: 0.5000 mL | Freq: Once | INTRAMUSCULAR | Status: DC

## 2017-07-25 MED ORDER — OXYTOCIN 40 UNITS IN LACTATED RINGERS INFUSION - SIMPLE MED: 2.5000 [IU]/h | INTRAVENOUS | Status: AC

## 2017-07-25 NOTE — Progress Notes (Signed)
Patient told to ambulate halls . Pain regimen told to patient and told to call out for pain meds. Patient told to use incentive spirometer and walk halls. Patient told to keep up with yellow feeding sheet.

## 2017-07-25 NOTE — Lactation Note (Signed)
This note was copied from a baby's chart. Lactation Consultation Note  Patient Name: Michelle Lozano Decemberatricia Linz ZOXWR'UToday's Date: 07/25/2017 Reason for consult: Initial assessment  Baby 15 hours old. Mom latching baby in modified cradle position with baby's head turned to the side. Assisted mom to reposition baby chest-to-chest and baby able to latch more deeply with lips flanged and some swallows noted. Mom's breast easily compressible and expressible with colostrum flowing bilaterally. Mom has large breasts with small nipples. Discussed progression of milk coming to volume and enc offering lots of STS and nursing with cues. [facility], aware of OP/BFSG and LC phone line assistance after D/C.   Maternal Data Has patient been taught Hand Expression?: Yes Does the patient have breastfeeding experience prior to this delivery?: No  Feeding Feeding Type: Breast Fed  LATCH Score Latch: Grasps breast easily, tongue down, lips flanged, rhythmical sucking.  Audible Swallowing: A few with stimulation  Type of Nipple: Everted at rest and after stimulation  Comfort (Breast/Nipple): Soft / non-tender  Hold (Positioning): Assistance needed to correctly position infant at breast and maintain latch.  LATCH Score: 8  Interventions Interventions: Breast feeding basics reviewed;Assisted with latch;Skin to skin;Breast compression;Adjust position;Position options  Lactation Tools Discussed/Used     Consult Status Consult Status: Follow-up Date: 07/26/17 Follow-up type: In-patient    Sherlyn HayJennifer D Hanaan Gancarz 07/25/2017, 12:59 PM

## 2017-07-25 NOTE — Progress Notes (Signed)
Subjective: POD# 1 Information for the patient's newborn:  Michelle JamesSouthard, Michelle Lozano [[id number]][[id number]]  female   circ declines Baby name: Sherilyn CooterHenry  Reports feeling well. Feeding: breast Patient reports tolerating PO.  Breast symptoms: latching well Pain controlled with PO meds Denies HA/SOB/C/P/N/V/dizziness. Flatus minimal. She reports vaginal bleeding as normal, without clots. Foley just out, no void yet, [facility]  Objective:   VS:    Vitals:   07/25/17 0230 07/25/17 0330 07/25/17 0500 07/25/17 0920  BP: (!) 116/58 120/63 (!) 108/55 (!) 98/58  Pulse: 77 81 80   Resp: 16 16 16 18   Temp: 98.8 F (37.1 C) 98.8 F (37.1 C) 99.2 F (37.3 C) 98 F (36.7 C)  TempSrc: Oral Oral Oral Oral  SpO2:      Weight:      Height:         Intake/Output Summary (Last 24 hours) at 07/25/17 1015 Last data filed at 07/25/17 [id number]  Gross per 24 hour  Intake             2660 ml  Output             2417 ml  Net              243 ml        Recent Labs  07/24/17 0046 07/25/17 0546  WBC 17.9* 23.5*  HGB 12.5 11.5*  HCT 36.6 34.2*  PLT 249 230     Blood type: --/--/O NEG (09/07 0049)  Rubella: Immune (01/29 0000)     Physical Exam:  General: alert, cooperative and no distress CV: Regular rate and rhythm Resp: clear Abdomen: soft, nontender, normal bowel sounds Incision: clean, dry and intact Uterine Fundus: firm, below umbilicus, nontender Lochia: minimal Ext: no edema, redness or tenderness in the calves or thighs      Assessment/Plan: 31 y.o.   POD# 1. G1P1001                  Principal Problem:   Postpartum care following cesarean delivery (9/7) Active Problems:   S/P cesarean section: indication: NRFHR and Arrest of dilation   Rh negative, maternal / newborn Rh neg also   Doing well, stable.               Advance diet as tolerated Encourage rest when baby rests Breastfeeding support Encourage to ambulate Routine post-op care  Neta Mendsaniela C Paul, CNM, MSN 07/25/2017,  10:15 AM

## 2017-07-25 NOTE — Anesthesia Postprocedure Evaluation (Signed)
Anesthesia Post Note  Patient: Michelle Lozano  Procedure(s) Performed: Procedure(s) (LRB): CESAREAN SECTION (N/A)     Patient location during evaluation: Mother Baby Anesthesia Type: Epidural Level of consciousness: awake and alert and oriented Pain management: pain level controlled Vital Signs Assessment: post-procedure vital signs reviewed and stable Respiratory status: spontaneous breathing and nonlabored ventilation Cardiovascular status: stable Postop Assessment: no headache, no backache, patient able to bend at knees, no signs of nausea or vomiting and adequate PO intake Anesthetic complications: no    Last Vitals:  Vitals:   07/25/17 0500 07/25/17 0920  BP: (!) 108/55 (!) 98/58  Pulse: 80   Resp: 16 18  Temp: 37.3 C 36.7 C  SpO2:      Last Pain:  Vitals:   07/25/17 0923  TempSrc:   PainSc: 3    Pain Goal:                 Madison HickmanGREGORY,Deicy Rusk

## 2017-07-25 NOTE — Anesthesia Postprocedure Evaluation (Signed)
Anesthesia Post Note  Patient: Michelle Lozano  Procedure(s) Performed: Procedure(s) (LRB): CESAREAN SECTION (N/A)     Patient location during evaluation: PACU Anesthesia Type: Epidural Level of consciousness: awake and alert and oriented Pain management: pain level controlled Vital Signs Assessment: post-procedure vital signs reviewed and stable Respiratory status: spontaneous breathing, nonlabored ventilation and respiratory function stable Cardiovascular status: blood pressure returned to baseline and stable Postop Assessment: no signs of nausea or vomiting, no headache, no backache, epidural receding and patient able to bend at knees Anesthetic complications: no    Last Vitals:  Vitals:   07/24/17 2346 07/24/17 2347  BP:    Pulse:    Resp: (!) 26 (!) 22  Temp:    SpO2:      Last Pain:  Vitals:   07/24/17 2351  TempSrc:   PainSc: 3    Pain Goal:                 [name]

## 2017-07-25 NOTE — Addendum Note (Signed)
Addendum  created 07/25/17 1055 by Shanon PayorGregory, Remy Dia M, CRNA   Sign clinical note

## 2017-07-25 NOTE — Progress Notes (Signed)
CSW attempted to meet with MOB to complete assessment for consult regarding edinburgh scale score greater than 9. Upon this writers arrival, MOB was accompanied by several visitors and informed this Clinical research associatewriter to come back at a later time as she is not discharging until tomorrow or Monday. CSW will visit with MOB at a later time.   Merida Alcantar, MSW, LCSW-A Clinical Social Worker  [facility]  Office: [phone]

## 2017-07-26 MED ORDER — ACETAMINOPHEN 325 MG PO TABS: 650.0000 mg | ORAL_TABLET | ORAL | Status: DC | PRN

## 2017-07-26 MED ORDER — SENNOSIDES-DOCUSATE SODIUM 8.6-50 MG PO TABS: 2.0000 | ORAL_TABLET | ORAL | Status: DC

## 2017-07-26 MED ORDER — COCONUT OIL OIL: 1.0000 "application " | TOPICAL_OIL | 0 refills | Status: DC | PRN

## 2017-07-26 MED ORDER — SIMETHICONE 80 MG PO CHEW: 80.0000 mg | CHEWABLE_TABLET | Freq: Three times a day (TID) | ORAL | 0 refills | Status: DC

## 2017-07-26 MED ORDER — OXYCODONE-ACETAMINOPHEN 5-325 MG PO TABS: 1.0000 | ORAL_TABLET | ORAL | 0 refills | Status: DC | PRN

## 2017-07-26 MED ORDER — IBUPROFEN 600 MG PO TABS: 600.0000 mg | ORAL_TABLET | Freq: Four times a day (QID) | ORAL | 0 refills | Status: DC

## 2017-07-26 NOTE — Progress Notes (Signed)
CSW acknowledges consult for Edenberg score > 9. CSW met with MOB at bedside to assess. MOB informed this Probation officer that she more so has a hx of anxiety versus depression; however, it is well managed and does not impact her daily living. CSW provided education regarding Baby Blues vs PMADs and provided MOB with information about support groups held at [facility] encouraged MOB to evaluate her mental health throughout the postpartum period with the use of the New Mom Checklist developed by Postpartum Progress and notify a medical professional if symptoms arise. CSW identifies no further need for intervention at this time or barriers to discharge.  Adelbert Gaspard, MSW, LCSW-A Clinical Social Worker  [facility]  Office: [phone]

## 2017-07-26 NOTE — Discharge Summary (Signed)
OB Discharge Summary     Patient Name: Michelle Lozano DOB: 08-Apr-1986 MRN: [id number]  Date of admission: 07/24/2017 Delivering MD: Olivia Mackie   Date of discharge: 07/26/2017  Admitting diagnosis: INDUCTION Intrauterine pregnancy: [redacted]w[redacted]d     Secondary diagnosis:  Principal Problem:   Postpartum care following cesarean delivery (9/7) Active Problems:   S/P cesarean section: indication: NRFHR and Arrest of dilation   Rh negative, maternal / newborn Rh neg also      Discharge diagnosis: Term Pregnancy Delivered and post-op day 2 stable                                                                                                Augmentation: AROM and Pitocin  Complications: None  Hospital course:  Induction of Labor With Cesarean Section  31 y.o. yo G1P1001 at [redacted]w[redacted]d was admitted to the hospital 07/24/2017 for induction of labor. Patient had a labor course significant for obesity and post dates. The patient went for cesarean section due to Arrest of Dilation and Non-Reassuring FHR, and delivered a Viable infant boy. Membrane Rupture Time/Date: )4:55 PM ,07/24/2017 . Details of operation can be found in separate operative Note.  Patient had an uncomplicated postpartum course. She is ambulating, tolerating a regular diet, passing flatus, and urinating well.  Patient is discharged home in stable condition on 07/26/17.                                    Physical exam  Vitals:   07/25/17 1530 07/25/17 1805 07/25/17 1900 07/26/17 0545  BP: (!) 98/53 117/64 (!) 116/59 121/67  Pulse: 85 77 80 65  Resp:  Temp:  98.5 F (36.9 C) 98.5 F (36.9 C) 97.9 F (36.6 C)  TempSrc:  Oral Oral Oral  SpO2: 98%     Weight:      Height:       General: alert, cooperative and no distress Lochia: appropriate Uterine Fundus: firm Incision: Dressing is clean, dry, and intact DVT Evaluation: No cords or calf tenderness. No significant calf/ankle edema. Labs: Lab Results  Component  Value Date   WBC 23.5 (H) 07/25/2017   HGB 11.5 (L) 07/25/2017   HCT 34.2 (L) 07/25/2017   MCV 88.4 07/25/2017   PLT 230 07/25/2017   No flowsheet data found.  Discharge instruction: per After Visit Summary and "Baby and Me Booklet".  After visit meds:  Allergies as of 07/26/2017   No Known Allergies     Medication List    STOP taking these medications   EVENING PRIMROSE OIL PO     TAKE these medications   acetaminophen 325 MG tablet Commonly known as:  TYLENOL Take 2 tablets (650 mg total) by mouth every 4 (four) hours as needed (for pain scale < 4).   calcium carbonate 500 MG chewable tablet Commonly known as:  TUMS - dosed in mg elemental calcium Chew 1 tablet by mouth 2 (two) times daily as needed for indigestion or heartburn.   coconut  oil Oil Apply 1 application topically as needed.   ibuprofen 600 MG tablet Commonly known as:  ADVIL,MOTRIN Take 1 tablet (600 mg total) by mouth every 6 (six) hours.   loratadine 10 MG tablet Commonly known as:  CLARITIN Take 10 mg by mouth daily as needed for allergies or rhinitis.   MAGNESIUM PO Take 2 tablets by mouth at bedtime. otc medication, not sure of strength   oxyCODONE-acetaminophen 5-325 MG tablet Commonly known as:  PERCOCET/ROXICET Take 1 tablet by mouth every 4 (four) hours as needed (pain scale 4-7).   prenatal multivitamin Tabs tablet Take 1 tablet by mouth every evening.   senna-docusate 8.6-50 MG tablet Commonly known as:  Senokot-S Take 2 tablets by mouth daily.   simethicone 80 MG chewable tablet Commonly known as:  MYLICON Chew 1 tablet (80 mg total) by mouth 3 (three) times daily after meals.              Diet: routine diet  Activity: Advance as tolerated. Pelvic rest for 6 weeks.   Outpatient follow up:6 weeks pp visit  Postpartum contraception: Not Discussed  Newborn Data: Live born female Sherilyn CooterHenry Birth Weight: 8 lb 11.5 oz (3955 g) APGAR: 8, 9  Baby Feeding:  Breast Disposition:home with mother   07/26/2017 Neta Mendsaniela C Paul, CNM

## 2018-04-12 IMAGING — US US MFM OB DETAIL+14 WK
1 series · 14 of 28 positions shown · non-contrast
Comparison: none

[Series 1: us mfm ob detail+14 wk · 14 of 83 slices shown]
[im 4/83]
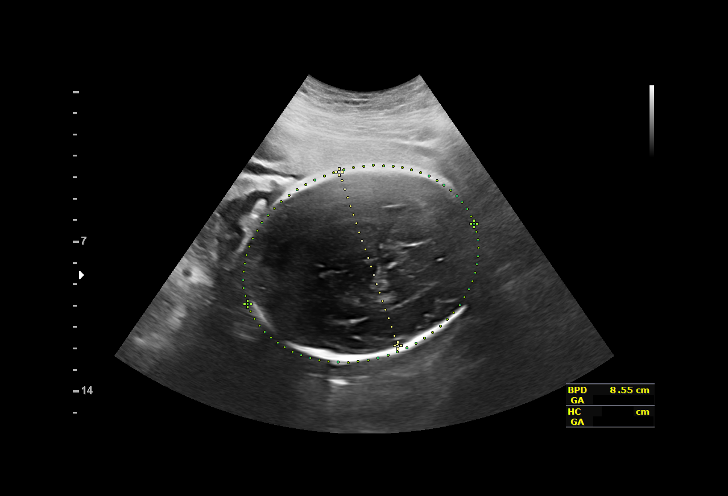
[im 10/83]
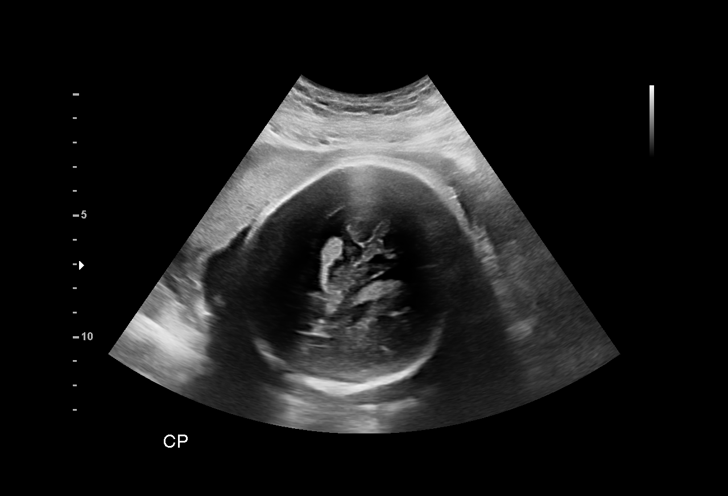
[im 16/83]
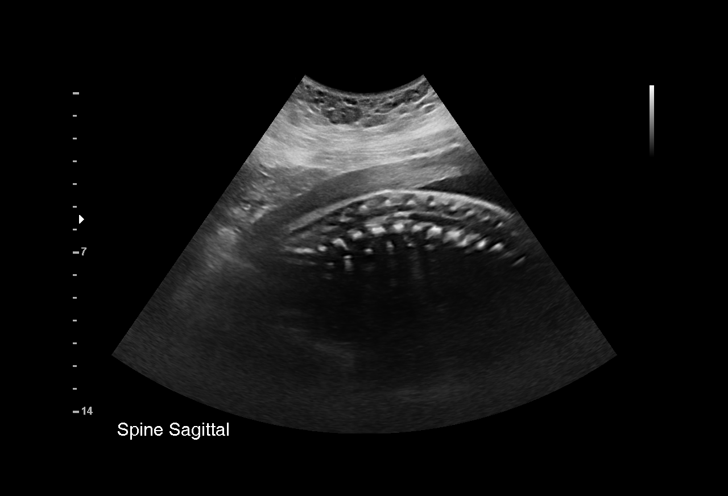
[im 22/83]
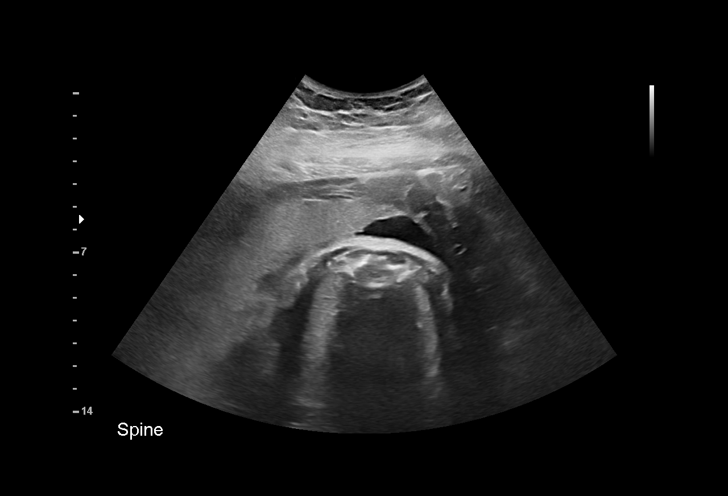
[im 28/83]
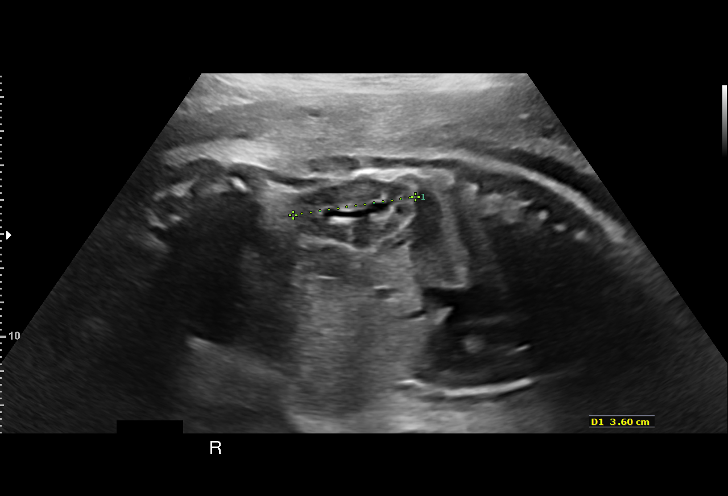
[im 34/83]
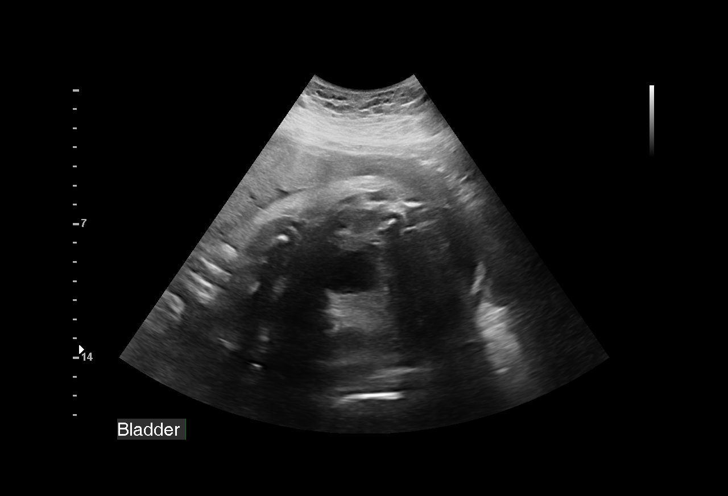
[im 40/83]
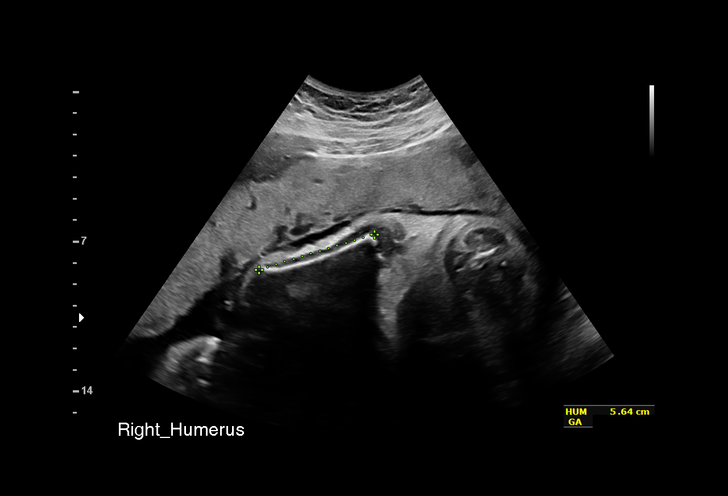
[im 46/83]
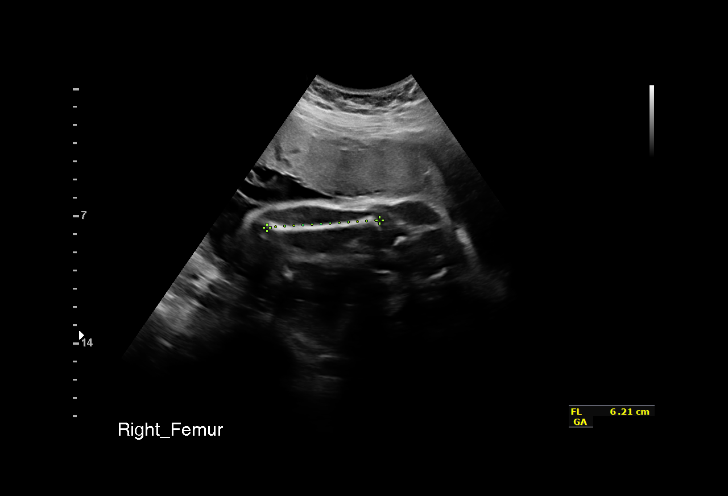
[im 52/83]
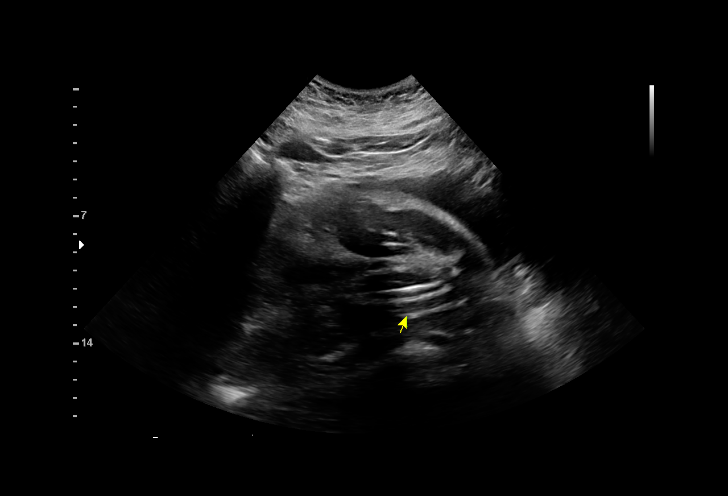
[im 58/83]
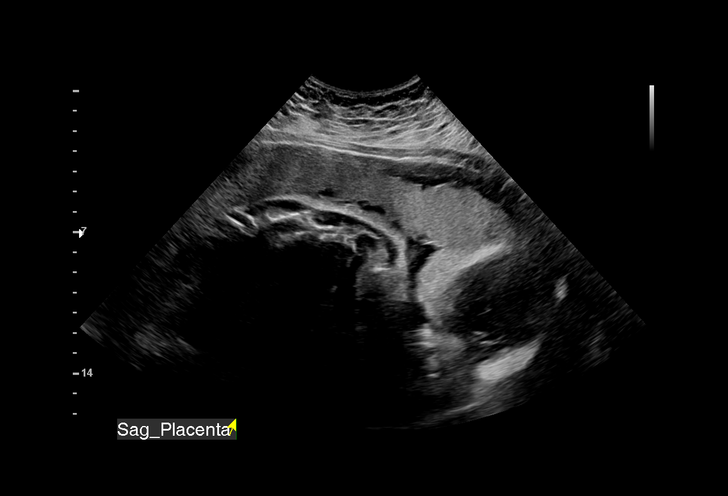
[im 64/83]
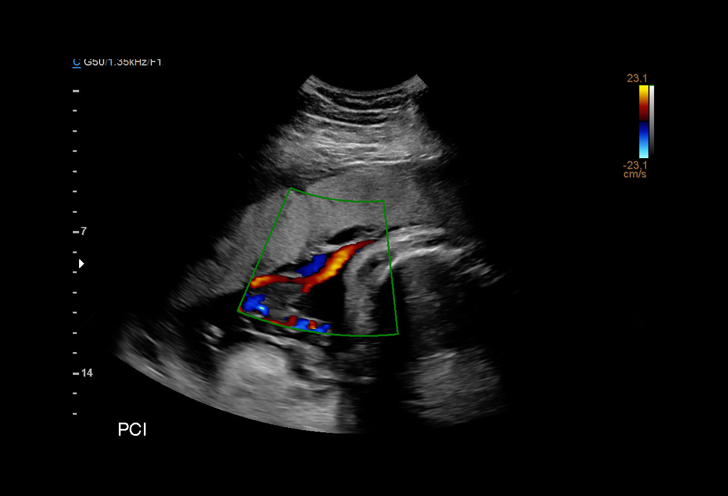
[im 70/83]
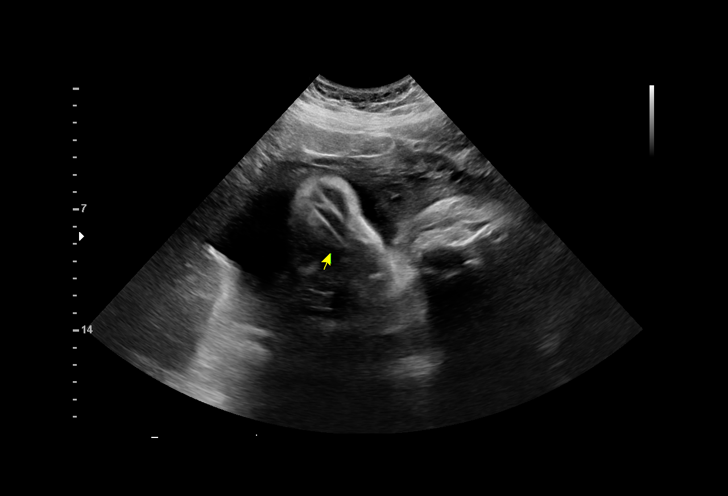
[im 76/83]
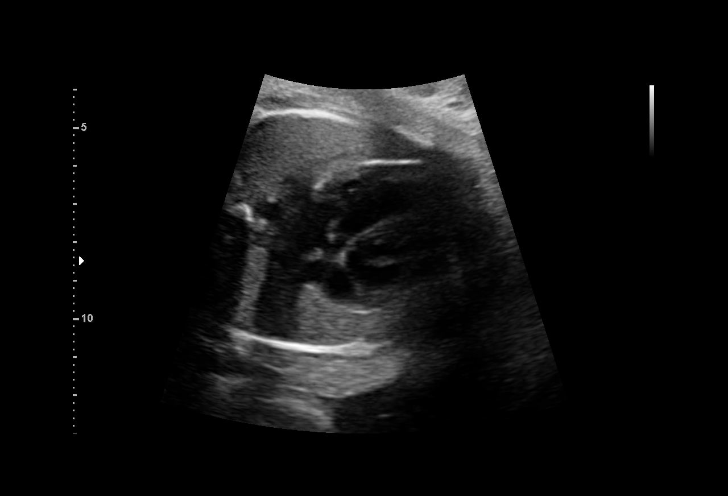
[im 83/83]
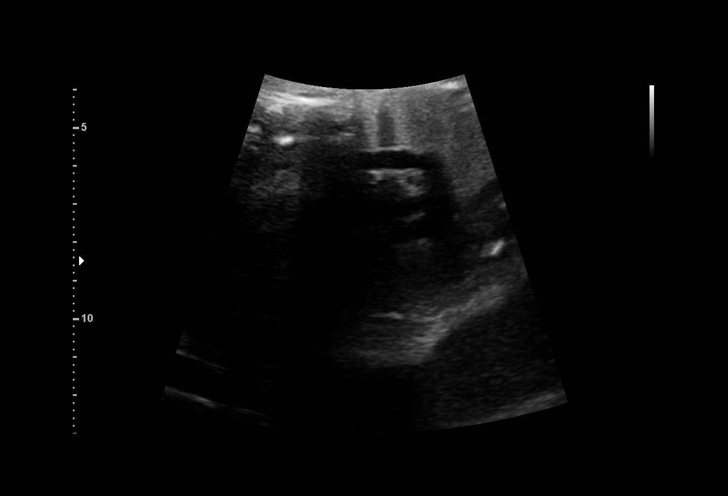

[14 of 28 positions shown; findings below may reference images not displayed]

1  NODLYN MAGDI             [id number]      [id number]     [id number]
Indications

33 weeks gestation of pregnancy
Obesity complicating pregnancy, third
trimester
Encounter for fetal anatomic survey
Previous cervical surgery (LEEP)
OB History

Blood Type:            Height:         Weight (lb):  278       BMI:
Gravidity:    1
Fetal Evaluation

Num Of Fetuses:     1
Fetal Heart         146
Rate(bpm):
Cardiac Activity:   Observed
Presentation:       Cephalic
Placenta:           Anterior, above cervical os
P. Cord Insertion:  Visualized

Amniotic Fluid
AFI FV:      Subjectively within normal limits

AFI Sum(cm)     %Tile       Largest Pocket(cm)
14.59           51

RUQ(cm)       RLQ(cm)       LUQ(cm)        LLQ(cm)
3.97
Biometry
BPD:      86.4  mm     G. Age:  34w 6d         90  %    CI:        74.92   %    70 - 86
FL/HC:      19.0   %    19.9 -
HC:      316.7  mm     G. Age:  35w 4d         79  %    HC/AC:      1.04        0.96 -
AC:       305   mm     G. Age:  34w 4d         87  %    FL/BPD:     69.6   %    71 - 87
FL:       60.1  mm     G. Age:  31w 2d          7  %    FL/AC:      19.7   %    20 - 24
HUM:      55.6  mm     G. Age:  32w 3d         46  %

Est. FW:    8858  gm           5 lb     73  %
Gestational Age

LMP:           33w 0d        Date:  10/09/16                 EDD:   07/16/17
U/S Today:     34w 1d                                        EDD:   07/08/17
Best:          33w 0d     Det. By:  LMP  (10/09/16)          EDD:   07/16/17
Anatomy

Cranium:               Appears normal         Aortic Arch:            Appears normal
Cavum:                 Not well visualized    Ductal Arch:            Appears normal
Ventricles:            Appears normal         Diaphragm:              Not well visualized
Choroid Plexus:        Appears normal         Stomach:                Appears normal, left
sided
Cerebellum:            Not well visualized    Abdomen:                Appears normal
Posterior Fossa:       Not well visualized    Abdominal Wall:         Not well visualized
Nuchal Fold:           Not applicable (>20    Cord Vessels:           Appears normal (3
wks GA)                                        vessel cord)
Face:                  Not well visualized    Kidneys:                Appear normal
Lips:                  Appears normal         Bladder:                Appears normal
Thoracic:              Appears normal         Spine:                  Appears normal
Heart:                 Appears normal         Upper Extremities:      Appears normal;
(4CH, axis, and situs                          hands nsw
RVOT:                  Appears normal         Lower Extremities:      Appears normal;
feet nsw
LVOT:                  Appears normal

Other:  Technically difficult due to advanced GA, fetal position, and maternal
habitus.
Cervix Uterus Adnexa

Cervix
Not visualized (advanced GA >78wks)
Impression

Single IUP at 33w 0d
Limited views of the fetal face and adbominal cord insertiion
obtained due to late gestational age and fetal position
The remainder of the fetal anatomy appears normal
The estimated fetal weight is at the 73rd %tile
Anterior placenta without previa
Normal amniotic fluid volume
Recommendations

Follow-up ultrasounds as clinically indicated.

## 2018-11-18 DIAGNOSIS — F419 Anxiety disorder, unspecified: Secondary | ICD-10-CM | POA: Insufficient documentation

## 2018-12-10 LAB — OB RESULTS CONSOLE GC/CHLAMYDIA
Chlamydia: NEGATIVE
Gonorrhea: NEGATIVE

## 2018-12-10 LAB — OB RESULTS CONSOLE HEPATITIS B SURFACE ANTIGEN: Hepatitis B Surface Ag: NEGATIVE

## 2018-12-10 LAB — OB RESULTS CONSOLE ABO/RH: RH Type: NEGATIVE

## 2018-12-10 LAB — OB RESULTS CONSOLE HIV ANTIBODY (ROUTINE TESTING): HIV: NONREACTIVE

## 2018-12-10 LAB — OB RESULTS CONSOLE RPR: RPR: NONREACTIVE

## 2018-12-10 LAB — OB RESULTS CONSOLE RUBELLA ANTIBODY, IGM: Rubella: IMMUNE

## 2018-12-10 LAB — OB RESULTS CONSOLE ANTIBODY SCREEN: Antibody Screen: NEGATIVE

## 2019-05-19 LAB — OB RESULTS CONSOLE GBS: GBS: POSITIVE

## 2019-05-31 ENCOUNTER — Encounter (HOSPITAL_COMMUNITY): Payer: Self-pay

## 2019-06-01 ENCOUNTER — Telehealth (HOSPITAL_COMMUNITY): Payer: Self-pay | Admitting: *Deleted

## 2019-06-01 NOTE — Telephone Encounter (Signed)
Preadmission screen  

## 2019-06-02 ENCOUNTER — Encounter (HOSPITAL_COMMUNITY): Payer: Self-pay

## 2019-06-02 NOTE — Patient Instructions (Signed)
Michelle Lozano  06/02/2019   Your procedure is scheduled on:  06/10/2019  Arrive at 1000 at Entrance C on [address] [facility]  and [address]. You are invited to use the FREE valet parking or use the Visitor's parking deck.  Pick up the phone at the desk and dial [phone].  Call this number if you have problems the morning of surgery: [phone]  Remember:   Do not eat food:(After Midnight) Desps de medianoche.  Do not drink clear liquids: (After Midnight) Desps de medianoche.  Take these medicines the morning of surgery with A SIP OF WATER:  fluoxitine   Do not wear jewelry, make-up or nail polish.  Do not wear lotions, powders, or perfumes. Do not wear deodorant.  Do not shave 48 hours prior to surgery.  Do not bring valuables to the hospital.  [facility] is not   responsible for any belongings or valuables brought to the hospital.  Contacts, dentures or bridgework may not be worn into surgery.  Leave suitcase in the car. After surgery it may be brought to your room.  For patients admitted to the hospital, checkout time is 11:00 AM the day of              discharge.      Please read over the following fact sheets that you were given:     Preparing for Surgery

## 2019-06-08 ENCOUNTER — Other Ambulatory Visit (HOSPITAL_COMMUNITY)
Admission: RE | Admit: 2019-06-08 | Discharge: 2019-06-08 | Disposition: A | Discharge status: Home / Self Care | Payer: 59 | Source: Ambulatory Visit | Attending: Obstetrics and Gynecology | Admitting: Obstetrics and Gynecology

## 2019-06-08 ENCOUNTER — Other Ambulatory Visit: Payer: Self-pay

## 2019-06-08 DIAGNOSIS — Z1159 Encounter for screening for other viral diseases: Secondary | ICD-10-CM | POA: Insufficient documentation

## 2019-06-08 HISTORY — DX: Anxiety disorder, unspecified: F41.9

## 2019-06-08 LAB — SARS CORONAVIRUS 2 (TAT 6-24 HRS): SARS Coronavirus 2: NEGATIVE

## 2019-06-08 NOTE — MAU Note (Signed)
Swab collected without difficulty. 

## 2019-06-09 ENCOUNTER — Encounter (HOSPITAL_COMMUNITY): Payer: Self-pay | Admitting: Obstetrics

## 2019-06-09 NOTE — H&P (Signed)
33 y.o. G2P1001 @ [redacted]w[redacted]d presents for repeat cesarean section.  Otherwise has good fetal movement and no bleeding.  Pregnancy c/b: 1. Obesity:  Starting BMI 45.  Most recent growth [place] at 34 weeks EFW >90% with AC>97%.   2. Anxiety: restarted prozac during pregnancy 3. GBS bacteruria in first trimester 4. History of cesarean section:  Declined trial of labor  Past Medical History:  Diagnosis Date  . Anxiety     Past Surgical History:  Procedure Laterality Date  . CESAREAN SECTION N/A 07/24/2017   Procedure: CESAREAN SECTION;  Surgeon: Brien Few, MD;  Location: [place];  Service: Obstetrics;  Laterality: N/A;  . LEEP    . TONSILLECTOMY      OB History  Gravida Para Term Preterm AB Living  2 1 1     1   SAB TAB Ectopic Multiple Live Births        0 1    # Outcome Date GA Lbr Len/2nd Weight Sex Delivery Anes PTL Lv  2 Current           1 Term 07/24/17 [redacted]w[redacted]d  3955 g M CS-LVertical EPI  LIV    Social History   Socioeconomic History  . Marital status: Married    Spouse name: Not on file  . Number of children: Not on file  . Years of education: Not on file  . Highest education level: Not on file  Occupational History  . Not on file  Social Needs  . Financial resource strain: Not hard at all  . Food insecurity    Worry: Never true    Inability: Never true  . Transportation needs    Medical: No    Non-medical: Not on file  Tobacco Use  . Smoking status: Former Smoker    Types: Cigarettes    Quit date: 07/25/2007    Years since quitting: 11.8  . Smokeless tobacco: Never Used  Substance and Sexual Activity  . Alcohol use: No  . Drug use: No  . Sexual activity: Yes    Birth control/protection: None  Lifestyle  . Physical activity    Days per week: Not on file    Minutes per session: Not on file  . Stress: To some extent   Patient has no known allergies.    Prenatal Transfer Tool  Maternal Diabetes: No Genetic Screening: Normal Maternal  Ultrasounds/Referrals: Normal Fetal Ultrasounds or other Referrals:  None Maternal Substance Abuse:  No Significant Maternal Medications:  Meds include: Prozac Significant Maternal Lab Results: Group B Strep positive  ABO, Rh: --/--/O NEG (07/24 1015) Antibody: NEG (07/24 1015) Rubella: Immune (01/24 0000) RPR: Nonreactive (01/24 0000)  HBsAg: Negative (01/24 0000)  HIV: Non-reactive (01/24 0000)  GBS: Positive (07/02 0000)    Vitals:   06/10/19 1011  BP: 129/78  Resp: 16  Temp: 98.2 F (36.8 C)  SpO2: 100%     General:  NAD Abdomen:  soft, gravid   A/P   33 y.o. G2P1001 [redacted]w[redacted]d presents for repeat cesarean section.  Discussed risks of cesarean section to include, but not limited to, infection, bleeding, damage to surrounding strutcures (including bowel, bladder, tubes, ovaries, nerves, vessels, baby), need for additional procedures, risk of blood clot, need for transfusion. Consent signed Ancef 3gm on call to [place]

## 2019-06-10 ENCOUNTER — Encounter (HOSPITAL_COMMUNITY)
Admission: RE | Disposition: A | Discharge status: Home / Self Care | Payer: Self-pay | Source: Home / Self Care | Attending: Obstetrics

## 2019-06-10 ENCOUNTER — Other Ambulatory Visit: Payer: Self-pay

## 2019-06-10 ENCOUNTER — Inpatient Hospital Stay (HOSPITAL_COMMUNITY): Payer: 59 | Admitting: Anesthesiology

## 2019-06-10 ENCOUNTER — Encounter (HOSPITAL_COMMUNITY): Payer: Self-pay | Admitting: *Deleted

## 2019-06-10 ENCOUNTER — Inpatient Hospital Stay (HOSPITAL_COMMUNITY)
Admission: RE | Admit: 2019-06-10 | Discharge: 2019-06-14 | DRG: 788 | Disposition: A | Discharge status: Home / Self Care | Payer: 59 | Attending: Obstetrics | Admitting: Obstetrics

## 2019-06-10 DIAGNOSIS — Z98891 History of uterine scar from previous surgery: Secondary | ICD-10-CM

## 2019-06-10 DIAGNOSIS — F419 Anxiety disorder, unspecified: Secondary | ICD-10-CM | POA: Diagnosis present

## 2019-06-10 DIAGNOSIS — Z3A39 39 weeks gestation of pregnancy: Secondary | ICD-10-CM | POA: Diagnosis not present

## 2019-06-10 DIAGNOSIS — O34211 Maternal care for low transverse scar from previous cesarean delivery: Secondary | ICD-10-CM | POA: Diagnosis present

## 2019-06-10 DIAGNOSIS — O99214 Obesity complicating childbirth: Secondary | ICD-10-CM | POA: Diagnosis present

## 2019-06-10 DIAGNOSIS — Z87891 Personal history of nicotine dependence: Secondary | ICD-10-CM | POA: Diagnosis not present

## 2019-06-10 DIAGNOSIS — O99824 Streptococcus B carrier state complicating childbirth: Secondary | ICD-10-CM | POA: Diagnosis present

## 2019-06-10 DIAGNOSIS — O99344 Other mental disorders complicating childbirth: Secondary | ICD-10-CM | POA: Diagnosis present

## 2019-06-10 LAB — CBC
HCT: 36.6 % (ref 36.0–46.0)
Hemoglobin: 11.1 g/dL — ABNORMAL LOW (ref 12.0–15.0)
MCH: 25.2 pg — ABNORMAL LOW (ref 26.0–34.0)
MCHC: 30.3 g/dL (ref 30.0–36.0)
MCV: 83 fL (ref 80.0–100.0)
Platelets: 364 10*3/uL (ref 150–400)
RBC: 4.41 MIL/uL (ref 3.87–5.11)
RDW: 14.2 % (ref 11.5–15.5)
WBC: 12.9 10*3/uL — ABNORMAL HIGH (ref 4.0–10.5)
nRBC: 0 % (ref 0.0–0.2)

## 2019-06-10 LAB — TYPE AND SCREEN
ABO/RH(D): O NEG
Antibody Screen: NEGATIVE

## 2019-06-10 LAB — ABO/RH: ABO/RH(D): O NEG

## 2019-06-10 SURGERY — Surgical Case
Anesthesia: Spinal | Wound class: Clean Contaminated

## 2019-06-10 MED ORDER — WITCH HAZEL-GLYCERIN EX PADS: 1.0000 "application " | MEDICATED_PAD | CUTANEOUS | Status: DC | PRN

## 2019-06-10 MED ORDER — ENOXAPARIN SODIUM 80 MG/0.8ML ~~LOC~~ SOLN
70.0000 mg | SUBCUTANEOUS | Status: DC
  Administered 2019-06-11 – 2019-06-14 (×4): 70 mg via SUBCUTANEOUS
  Filled 2019-06-10 (×4): qty 0.8

## 2019-06-10 MED ORDER — PRENATAL MULTIVITAMIN CH
1.0000 | ORAL_TABLET | Freq: Every day | ORAL | Status: DC
  Administered 2019-06-11 – 2019-06-13 (×3): 1 via ORAL
  Filled 2019-06-10 (×3): qty 1

## 2019-06-10 MED ORDER — SIMETHICONE 80 MG PO CHEW
80.0000 mg | CHEWABLE_TABLET | ORAL | Status: DC
  Administered 2019-06-11 – 2019-06-13 (×4): 80 mg via ORAL
  Filled 2019-06-10 (×4): qty 1

## 2019-06-10 MED ORDER — COCONUT OIL OIL: 1.0000 "application " | TOPICAL_OIL | Status: DC | PRN

## 2019-06-10 MED ORDER — KETOROLAC TROMETHAMINE 30 MG/ML IJ SOLN
30.0000 mg | Freq: Four times a day (QID) | INTRAMUSCULAR | Status: AC
  Administered 2019-06-10 – 2019-06-11 (×4): 30 mg via INTRAVENOUS
  Filled 2019-06-10 (×4): qty 1

## 2019-06-10 MED ORDER — SODIUM CHLORIDE 0.9 % IV SOLN
INTRAVENOUS | Status: DC | PRN
  Administered 2019-06-10: 13:00:00 60 ug/min via INTRAVENOUS

## 2019-06-10 MED ORDER — SCOPOLAMINE 1 MG/3DAYS TD PT72
1.0000 | MEDICATED_PATCH | Freq: Once | TRANSDERMAL | Status: AC
  Administered 2019-06-10: 17:00:00 1.5 mg via TRANSDERMAL
  Filled 2019-06-10: qty 1

## 2019-06-10 MED ORDER — LACTATED RINGERS IV SOLN: INTRAVENOUS | Status: DC

## 2019-06-10 MED ORDER — SODIUM CHLORIDE 0.9% FLUSH: 3.0000 mL | INTRAVENOUS | Status: DC | PRN

## 2019-06-10 MED ORDER — METOCLOPRAMIDE HCL 5 MG/ML IJ SOLN
INTRAMUSCULAR | Status: AC
  Filled 2019-06-10: qty 2

## 2019-06-10 MED ORDER — ONDANSETRON HCL 4 MG/2ML IJ SOLN: 4.0000 mg | Freq: Three times a day (TID) | INTRAMUSCULAR | Status: DC | PRN

## 2019-06-10 MED ORDER — NALBUPHINE HCL 10 MG/ML IJ SOLN: 5.0000 mg | INTRAMUSCULAR | Status: DC | PRN

## 2019-06-10 MED ORDER — IBUPROFEN 800 MG PO TABS
800.0000 mg | ORAL_TABLET | Freq: Four times a day (QID) | ORAL | Status: DC
  Administered 2019-06-11 – 2019-06-14 (×10): 800 mg via ORAL
  Filled 2019-06-10 (×11): qty 1

## 2019-06-10 MED ORDER — FLUOXETINE HCL 20 MG PO CAPS
40.0000 mg | ORAL_CAPSULE | Freq: Every day | ORAL | Status: DC
  Administered 2019-06-11 – 2019-06-14 (×4): 40 mg via ORAL
  Filled 2019-06-10 (×3): qty 2
  Filled 2019-06-10: qty 4
  Filled 2019-06-10: qty 2
  Filled 2019-06-10 (×3): qty 4

## 2019-06-10 MED ORDER — LACTATED RINGERS IV SOLN
INTRAVENOUS | Status: DC
  Administered 2019-06-10 (×4): via INTRAVENOUS

## 2019-06-10 MED ORDER — PHENYLEPHRINE 40 MCG/ML (10ML) SYRINGE FOR IV PUSH (FOR BLOOD PRESSURE SUPPORT)
PREFILLED_SYRINGE | INTRAVENOUS | Status: AC
  Filled 2019-06-10: qty 10

## 2019-06-10 MED ORDER — DEXAMETHASONE SODIUM PHOSPHATE 4 MG/ML IJ SOLN
INTRAMUSCULAR | Status: DC | PRN
  Administered 2019-06-10: 4 mg via INTRAVENOUS

## 2019-06-10 MED ORDER — ONDANSETRON HCL 4 MG/2ML IJ SOLN
INTRAMUSCULAR | Status: AC
  Filled 2019-06-10: qty 2

## 2019-06-10 MED ORDER — OXYCODONE HCL 5 MG PO TABS
5.0000 mg | ORAL_TABLET | ORAL | Status: DC | PRN
  Administered 2019-06-11 (×2): 10 mg via ORAL
  Administered 2019-06-12: 10:00:00 5 mg via ORAL
  Administered 2019-06-12 – 2019-06-14 (×7): 10 mg via ORAL
  Filled 2019-06-10 (×2): qty 2
  Filled 2019-06-10: qty 1
  Filled 2019-06-10 (×7): qty 2

## 2019-06-10 MED ORDER — OXYTOCIN 40 UNITS IN NORMAL SALINE INFUSION - SIMPLE MED: 2.5000 [IU]/h | INTRAVENOUS | Status: AC

## 2019-06-10 MED ORDER — SCOPOLAMINE 1 MG/3DAYS TD PT72
MEDICATED_PATCH | TRANSDERMAL | Status: AC
  Filled 2019-06-10: qty 1

## 2019-06-10 MED ORDER — SIMETHICONE 80 MG PO CHEW: 80.0000 mg | CHEWABLE_TABLET | ORAL | Status: DC | PRN

## 2019-06-10 MED ORDER — ACETAMINOPHEN 500 MG PO TABS
1000.0000 mg | ORAL_TABLET | Freq: Four times a day (QID) | ORAL | Status: DC
  Administered 2019-06-10 – 2019-06-14 (×14): 1000 mg via ORAL
  Filled 2019-06-10 (×17): qty 2

## 2019-06-10 MED ORDER — FENTANYL CITRATE (PF) 100 MCG/2ML IJ SOLN: 25.0000 ug | INTRAMUSCULAR | Status: DC | PRN

## 2019-06-10 MED ORDER — ACETAMINOPHEN 10 MG/ML IV SOLN
INTRAVENOUS | Status: AC
  Filled 2019-06-10: qty 100

## 2019-06-10 MED ORDER — METOCLOPRAMIDE HCL 5 MG/ML IJ SOLN
INTRAMUSCULAR | Status: DC | PRN
  Administered 2019-06-10: 10 mg via INTRAVENOUS

## 2019-06-10 MED ORDER — FENTANYL CITRATE (PF) 100 MCG/2ML IJ SOLN
INTRAMUSCULAR | Status: DC | PRN
  Administered 2019-06-10: 15 ug via INTRATHECAL

## 2019-06-10 MED ORDER — PHENYLEPHRINE HCL (PRESSORS) 10 MG/ML IV SOLN
INTRAVENOUS | Status: DC | PRN
  Administered 2019-06-10: 200 ug via INTRAVENOUS
  Administered 2019-06-10: 80 ug via INTRAVENOUS

## 2019-06-10 MED ORDER — MORPHINE SULFATE (PF) 0.5 MG/ML IJ SOLN
INTRAMUSCULAR | Status: AC
  Filled 2019-06-10: qty 10

## 2019-06-10 MED ORDER — DIPHENHYDRAMINE HCL 25 MG PO CAPS
25.0000 mg | ORAL_CAPSULE | Freq: Four times a day (QID) | ORAL | Status: DC | PRN
  Administered 2019-06-11 (×2): 25 mg via ORAL
  Filled 2019-06-10 (×2): qty 1

## 2019-06-10 MED ORDER — DIPHENHYDRAMINE HCL 50 MG/ML IJ SOLN: 12.5000 mg | INTRAMUSCULAR | Status: DC | PRN

## 2019-06-10 MED ORDER — DIPHENHYDRAMINE HCL 25 MG PO CAPS: 25.0000 mg | ORAL_CAPSULE | ORAL | Status: DC | PRN

## 2019-06-10 MED ORDER — MORPHINE SULFATE (PF) 0.5 MG/ML IJ SOLN
INTRAMUSCULAR | Status: DC | PRN
  Administered 2019-06-10: .15 mg via EPIDURAL

## 2019-06-10 MED ORDER — PHENYLEPHRINE HCL-NACL 20-0.9 MG/250ML-% IV SOLN
INTRAVENOUS | Status: AC
  Filled 2019-06-10: qty 250

## 2019-06-10 MED ORDER — NALBUPHINE HCL 10 MG/ML IJ SOLN: 5.0000 mg | Freq: Once | INTRAMUSCULAR | Status: DC | PRN

## 2019-06-10 MED ORDER — NALOXONE HCL 0.4 MG/ML IJ SOLN: 0.4000 mg | INTRAMUSCULAR | Status: DC | PRN

## 2019-06-10 MED ORDER — BUPIVACAINE IN DEXTROSE 0.75-8.25 % IT SOLN
INTRATHECAL | Status: DC | PRN
  Administered 2019-06-10: 1.8 mL via INTRATHECAL

## 2019-06-10 MED ORDER — SODIUM CHLORIDE 0.9 % IV SOLN
INTRAVENOUS | Status: DC | PRN
  Administered 2019-06-10: 14:00:00 via INTRAVENOUS

## 2019-06-10 MED ORDER — DIBUCAINE (PERIANAL) 1 % EX OINT: 1.0000 "application " | TOPICAL_OINTMENT | CUTANEOUS | Status: DC | PRN

## 2019-06-10 MED ORDER — DEXTROSE 5 % IV SOLN
INTRAVENOUS | Status: AC
  Filled 2019-06-10: qty 3000

## 2019-06-10 MED ORDER — ONDANSETRON HCL 4 MG/2ML IJ SOLN
INTRAMUSCULAR | Status: DC | PRN
  Administered 2019-06-10: 4 mg via INTRAVENOUS

## 2019-06-10 MED ORDER — ACETAMINOPHEN 10 MG/ML IV SOLN
1000.0000 mg | Freq: Once | INTRAVENOUS | Status: DC | PRN
  Administered 2019-06-10: 16:00:00 1000 mg via INTRAVENOUS

## 2019-06-10 MED ORDER — OXYTOCIN 10 UNIT/ML IJ SOLN
INTRAMUSCULAR | Status: DC | PRN
  Administered 2019-06-10: 40 [IU]

## 2019-06-10 MED ORDER — OXYTOCIN 40 UNITS IN NORMAL SALINE INFUSION - SIMPLE MED
INTRAVENOUS | Status: AC
  Filled 2019-06-10: qty 1000

## 2019-06-10 MED ORDER — SIMETHICONE 80 MG PO CHEW
80.0000 mg | CHEWABLE_TABLET | Freq: Three times a day (TID) | ORAL | Status: DC
  Administered 2019-06-10 – 2019-06-14 (×8): 80 mg via ORAL
  Filled 2019-06-10 (×10): qty 1

## 2019-06-10 MED ORDER — MENTHOL 3 MG MT LOZG: 1.0000 | LOZENGE | OROMUCOSAL | Status: DC | PRN

## 2019-06-10 MED ORDER — DEXTROSE 5 % IV SOLN
3.0000 g | INTRAVENOUS | Status: AC
  Administered 2019-06-10: 13:00:00 3 g via INTRAVENOUS
  Filled 2019-06-10 (×2): qty 3000

## 2019-06-10 MED ORDER — SENNOSIDES-DOCUSATE SODIUM 8.6-50 MG PO TABS
2.0000 | ORAL_TABLET | ORAL | Status: DC
  Administered 2019-06-11 – 2019-06-13 (×4): 2 via ORAL
  Filled 2019-06-10 (×4): qty 2

## 2019-06-10 MED ORDER — NALOXONE HCL 4 MG/10ML IJ SOLN
1.0000 ug/kg/h | INTRAVENOUS | Status: DC | PRN
  Filled 2019-06-10: qty 5

## 2019-06-10 MED ORDER — FENTANYL CITRATE (PF) 100 MCG/2ML IJ SOLN
INTRAMUSCULAR | Status: AC
  Filled 2019-06-10: qty 2

## 2019-06-10 SURGICAL SUPPLY — 39 items
BENZOIN TINCTURE PRP APPL 2/3 (GAUZE/BANDAGES/DRESSINGS) ×2 IMPLANT
CHLORAPREP W/TINT 26ML (MISCELLANEOUS) ×2 IMPLANT
CLAMP CORD UMBIL (MISCELLANEOUS) IMPLANT
CLOSURE STERI STRIP 1/2 X4 (GAUZE/BANDAGES/DRESSINGS) ×1 IMPLANT
CLOTH BEACON ORANGE TIMEOUT ST (SAFETY) ×2 IMPLANT
DRSG OPSITE POSTOP 4X10 (GAUZE/BANDAGES/DRESSINGS) ×2 IMPLANT
ELECT REM PT RETURN 9FT ADLT (ELECTROSURGICAL) ×2
ELECTRODE REM PT RTRN 9FT ADLT (ELECTROSURGICAL) ×1 IMPLANT
EXTRACTOR VACUUM KIWI (MISCELLANEOUS) IMPLANT
GLOVE BIOGEL PI IND STRL 6.5 (GLOVE) ×1 IMPLANT
GLOVE BIOGEL PI IND STRL 7.0 (GLOVE) ×1 IMPLANT
GLOVE BIOGEL PI INDICATOR 6.5 (GLOVE) ×1
GLOVE BIOGEL PI INDICATOR 7.0 (GLOVE) ×1
GLOVE ECLIPSE 6.0 STRL STRAW (GLOVE) ×2 IMPLANT
GOWN STRL REUS W/TWL LRG LVL3 (GOWN DISPOSABLE) ×4 IMPLANT
HOVERMATT SINGLE USE (MISCELLANEOUS) ×1 IMPLANT
KIT ABG SYR 3ML LUER SLIP (SYRINGE) IMPLANT
NDL HYPO 25X5/8 SAFETYGLIDE (NEEDLE) IMPLANT
NEEDLE HYPO 25X5/8 SAFETYGLIDE (NEEDLE) IMPLANT
NS IRRIG 1000ML POUR BTL (IV SOLUTION) ×2 IMPLANT
PACK C SECTION WH (CUSTOM PROCEDURE TRAY) ×2 IMPLANT
PAD OB MATERNITY 4.3X12.25 (PERSONAL CARE ITEMS) ×2 IMPLANT
PENCIL SMOKE EVAC W/HOLSTER (ELECTROSURGICAL) ×2 IMPLANT
RTRCTR C-SECT PINK 25CM LRG (MISCELLANEOUS) ×2 IMPLANT
STRIP CLOSURE SKIN 1/2X4 (GAUZE/BANDAGES/DRESSINGS) ×2 IMPLANT
SUT MNCRL 0 VIOLET CTX 36 (SUTURE) ×2 IMPLANT
SUT MNCRL AB 3-0 PS2 27 (SUTURE) ×2 IMPLANT
SUT MONOCRYL 0 CTX 36 (SUTURE) ×2
SUT PLAIN 0 NONE (SUTURE) IMPLANT
SUT PLAIN 2 0 (SUTURE) ×1
SUT PLAIN 2 0 XLH (SUTURE) ×1 IMPLANT
SUT PLAIN ABS 2-0 CT1 27XMFL (SUTURE) ×1 IMPLANT
SUT VIC AB 0 CTX 36 (SUTURE) ×2
SUT VIC AB 0 CTX36XBRD ANBCTRL (SUTURE) ×2 IMPLANT
SUT VIC AB 2-0 CT1 27 (SUTURE) ×2
SUT VIC AB 2-0 CT1 TAPERPNT 27 (SUTURE) ×1 IMPLANT
TOWEL OR 17X24 6PK STRL BLUE (TOWEL DISPOSABLE) ×2 IMPLANT
TRAY FOLEY W/BAG SLVR 14FR LF (SET/KITS/TRAYS/PACK) ×2 IMPLANT
WATER STERILE IRR 1000ML POUR (IV SOLUTION) ×2 IMPLANT

## 2019-06-10 NOTE — Transfer of Care (Signed)
Immediate Anesthesia Transfer of Care Note  Patient: Michelle Lozano  Procedure(s) Performed: CESAREAN SECTION (N/A )  Patient Location: PACU  Anesthesia Type:Spinal  Level of Consciousness: awake, alert  and oriented  Airway & Oxygen Therapy: Patient Spontanous Breathing  Post-op Assessment: Report given to RN and Post -op Vital signs reviewed and stable  Post vital signs: Reviewed and stable  Last Vitals:  Vitals Value Taken Time  BP 101/61 06/10/19 1425  Temp    Pulse 74 06/10/19 1426  Resp 13 06/10/19 1426  SpO2 95 % 06/10/19 1426  Vitals shown include unvalidated device data.  Last Pain:  Vitals:   06/10/19 1011  TempSrc: Oral         Complications: No apparent anesthesia complications

## 2019-06-10 NOTE — Anesthesia Preprocedure Evaluation (Signed)
Anesthesia Evaluation  Patient identified by MRN, date of birth, ID band Patient awake    Reviewed: Allergy & Precautions, NPO status , Patient's Chart, lab work & pertinent test results  Airway Mallampati: III  TM Distance: >3 FB Neck ROM: Full    Dental no notable dental hx.    Pulmonary neg pulmonary ROS, former smoker,    Pulmonary exam normal breath sounds clear to auscultation       Cardiovascular negative cardio ROS Normal cardiovascular exam Rhythm:Regular Rate:Normal     Neuro/Psych PSYCHIATRIC DISORDERS Anxiety negative neurological ROS     GI/Hepatic negative GI ROS, Neg liver ROS,   Endo/Other  Morbid obesity  Renal/GU negative Renal ROS  negative genitourinary   Musculoskeletal negative musculoskeletal ROS (+)   Abdominal   Peds  Hematology negative hematology ROS (+)   Anesthesia Other Findings   Reproductive/Obstetrics (+) Pregnancy                             Anesthesia Physical Anesthesia Plan  ASA: III  Anesthesia Plan: Spinal   Post-op Pain Management:    Induction:   PONV Risk Score and Plan: Treatment may vary due to age or medical condition  Airway Management Planned: Natural Airway  Additional Equipment:   Intra-op Plan:   Post-operative Plan:   Informed Consent: I have reviewed the patients History and Physical, chart, labs and discussed the procedure including the risks, benefits and alternatives for the proposed anesthesia with the patient or authorized representative who has indicated his/her understanding and acceptance.     Dental advisory given  Plan Discussed with: CRNA  Anesthesia Plan Comments:         Anesthesia Quick Evaluation

## 2019-06-10 NOTE — Anesthesia Procedure Notes (Signed)
Spinal  Patient location during procedure: OR Start time: 06/10/2019 1:00 PM End time: 06/10/2019 1:10 PM Staffing Anesthesiologist: Freddrick March, MD Performed: anesthesiologist  Preanesthetic Checklist Completed: patient identified, surgical consent, pre-op evaluation, timeout performed, IV checked, risks and benefits discussed and monitors and equipment checked Spinal Block Patient position: sitting Prep: site prepped and draped and DuraPrep Patient monitoring: cardiac monitor, continuous pulse ox and blood pressure Approach: midline Location: L3-4 Injection technique: single-shot Needle Needle type: Pencan  Needle gauge: 24 G Needle length: 9 cm Assessment Sensory level: T6 Additional Notes Functioning IV was confirmed and monitors were applied. Sterile prep and drape, including hand hygiene and sterile gloves were used. The patient was positioned and the spine was prepped. The skin was anesthetized with lidocaine.  Free flow of clear CSF was obtained prior to injecting local anesthetic into the CSF.  The spinal needle aspirated freely following injection.  The needle was carefully withdrawn.  The patient tolerated the procedure well.

## 2019-06-10 NOTE — Progress Notes (Signed)
Pt OOB and ambulating without difficulty. Peri care performed. IV saline locked. Pt denies any nausea. OB assessment WNL. Pt taken to NICU. Toya Smothers, RN

## 2019-06-10 NOTE — Op Note (Signed)
Cesarean Section Procedure Note  Pre-operative Diagnosis: 1. Intrauterine pregnancy at [redacted]w[redacted]d  2. History of prior cesarean section, declines trial of labor.  3.  Morbid obesity  Post-operative Diagnosis: same as above  Surgeon: Jerelyn Charles, MD  Assistants: Bobbye Charleston, MD  Procedure: Repeat low transverse cesarean section   Anesthesia: Spinal anesthesia  Estimated Blood Loss: 169 mL         Drains: Foley catheter         Specimens: placenta to L&D to pathology                   Disposition: PACU - hemodynamically stable.  Findings:  Normal uterus, tubes and ovaries bilaterally.  Viable female infant, 4090g (9lb 0.3oz) Apgars 6, 7.    Procedure Details   After spinal  anesthesia was found to adequate, the patient was placed in the dorsal supine position with a leftward tilt, prepped and draped in the usual sterile manner. A Pfannenstiel incision was made and carried down through the subcutaneous tissue to the fascia.  The fascia was incised in the midline and the fascial incision was extended laterally with Mayo scissors. The superior aspect of the fascial incision was grasped with two Kocher clamps, tented up and the rectus muscles dissected off sharply. The rectus was then dissected off with blunt dissection and Mayo scissors inferiorly. The rectus muscles were separated in the midline. The abdominal peritoneum was identified, tented up, entered bluntly, and the incision was extended superiorly and inferiorly with good visualization of the bladder. The Alexis retractor was deployed. The vesicouterine peritoneum was identified, tented up, entered sharply, and the bladder flap was created digitally. A scalpel was then used to make a low transverse incision on the uterus which was extended in the cephalad-caudad direction with blunt dissection. The fluid was stained with mecondiumr. The fetal vertex was identified, elevated out of the pelvis and brought to the hysterotomy.  The head was  delivered easily followed by the shoulders and body.  After a 60 second delay per protocol, the cord was clamped and cut and the infant was passed to the waiting neonatologist.  The placenta was then delivered spontaneously, intact and appear normal, the uterus was cleared of all clot and debris   The hysterotomy was repaired with #0 Monocryl in running locked fashion.  A second imbricating layer of #0 Monocryl was placed.   The Alexis retractor was removed from the abdomen. The peritoneum was examined and all vessels noted to be hemostatic. The abdominal cavity was cleared of all clot and debris.  There was a vessel bleeding at the upper edge of the peritoneum.  2 figure of eight stitches were placed, and then the peritoneum was closed with 2-0 vicryl in a running fashion.  Bleeding was controlled. The rectus muscles were then closed with 2-0 Vicryl. The fascia and rectus muscles were inspected and were hemostatic. The fascia was closed with 0 Vicryl in a running fashion. The subcutaneous layer was irrigated and all bleeders cauterized. The subcutaneous layer was closed with interrupted plain gut. The skin was closed with 3-0 monocryl in a subcuticular fashion. The incision was dressed with benzoine, steri strips and honeycomb dressing. All sponge lap and needle counts were correct x3. Patient tolerated the procedure well and recovered in stable condition following the procedure.

## 2019-06-10 NOTE — Anesthesia Postprocedure Evaluation (Signed)
Anesthesia Post Note  Patient: Michelle Lozano  Procedure(s) Performed: CESAREAN SECTION (N/A )     Patient location during evaluation: PACU Anesthesia Type: Spinal Level of consciousness: oriented and awake and alert Pain management: pain level controlled Vital Signs Assessment: post-procedure vital signs reviewed and stable Respiratory status: spontaneous breathing, respiratory function stable and patient connected to nasal cannula oxygen Cardiovascular status: blood pressure returned to baseline and stable Postop Assessment: no headache, no backache and no apparent nausea or vomiting Anesthetic complications: no    Last Vitals:  Vitals:   06/10/19 1515 06/10/19 1530  BP: 105/62 113/83  Pulse:  81  Resp: 14 19  Temp:  36.6 C  SpO2: 100% 100%    Last Pain:  Vitals:   06/10/19 1530  TempSrc: Oral  PainSc: 0-No pain   Pain Goal:    LLE Motor Response: Purposeful movement (06/10/19 1530) LLE Sensation: Tingling (06/10/19 1530) RLE Motor Response: Purposeful movement (06/10/19 1530) RLE Sensation: Tingling (06/10/19 1530)     Epidural/Spinal Function Cutaneous sensation: Tingles (06/10/19 1530), Patient able to flex knees: Yes (06/10/19 1530), Patient able to lift hips off bed: No (06/10/19 1530), Back pain beyond tenderness at insertion site: No (06/10/19 1530), Progressively worsening motor and/or sensory loss: No (06/10/19 1530), Bowel and/or bladder incontinence post epidural: No (06/10/19 1530)  Aakash Hollomon L Lorma Heater

## 2019-06-11 ENCOUNTER — Encounter (HOSPITAL_COMMUNITY): Payer: Self-pay | Admitting: Obstetrics

## 2019-06-11 LAB — CBC
HCT: 35 % — ABNORMAL LOW (ref 36.0–46.0)
Hemoglobin: 10.9 g/dL — ABNORMAL LOW (ref 12.0–15.0)
MCH: 25.6 pg — ABNORMAL LOW (ref 26.0–34.0)
MCHC: 31.1 g/dL (ref 30.0–36.0)
MCV: 82.2 fL (ref 80.0–100.0)
Platelets: 309 10*3/uL (ref 150–400)
RBC: 4.26 MIL/uL (ref 3.87–5.11)
RDW: 14 % (ref 11.5–15.5)
WBC: 16.5 10*3/uL — ABNORMAL HIGH (ref 4.0–10.5)
nRBC: 0 % (ref 0.0–0.2)

## 2019-06-11 LAB — CREATININE, SERUM
Creatinine, Ser: 0.74 mg/dL (ref 0.44–1.00)
GFR calc Af Amer: >60 mL/min (ref >60)
GFR calc non Af Amer: >60 mL/min (ref >60)

## 2019-06-11 LAB — RPR: RPR Ser Ql: NONREACTIVE

## 2019-06-11 MED ORDER — RHO D IMMUNE GLOBULIN 1500 UNIT/2ML IJ SOSY
300.0000 ug | PREFILLED_SYRINGE | Freq: Once | INTRAMUSCULAR | Status: AC
  Administered 2019-06-11: 12:00:00 300 ug via INTRAVENOUS
  Filled 2019-06-11: qty 2

## 2019-06-11 NOTE — Progress Notes (Signed)
  Patient is eating, ambulating, voiding.  Pain control is good.  Vitals:   06/10/19 1700 06/10/19 2045 06/11/19 0018 06/11/19 0538  BP: 112/60 (!) 111/59 129/70 121/72  Pulse: 76 69 (!) 57 71  Resp: 18 18 17 18   Temp: 98.2 F (36.8 C) 98.7 F (37.1 C) 98.2 F (36.8 C) 97.9 F (36.6 C)  TempSrc: Oral Oral Oral Oral  SpO2: 100% 98% 99% 100%  Weight:      Height:        lungs:   clear to auscultation cor:    RRR Abdomen:  soft, appropriate tenderness, incisions intact and without erythema or exudate ex:    no cords   Lab Results  Component Value Date   WBC 16.5 (H) 06/11/2019   HGB 10.9 (L) 06/11/2019   HCT 35.0 (L) 06/11/2019   MCV 82.2 06/11/2019   PLT 309 06/11/2019    --/--/O NEG, O NEG Performed at [facility] [address], [place], [place] [address]  (07/24 1015)/RI  A/P    Post operative day 1.  Baby in NICU for TTN- stable.  Routine post op and postpartum care.  Expect d/c in 2 days.  Percocet for pain control.  Baby is RH POS- Rhogam to pt.

## 2019-06-11 NOTE — Lactation Note (Signed)
This note was copied from a baby's chart. Lactation Consultation Note:  P2, infant is 39+2 week and was transferred to NICU with respiratory distress.   Mother has one son that she breastfed almost 2 yrs.   Mother reports that she has pumped 5 times and has expressed some colostrum for infant.  Mother was instructed in benefit of hand expression. Hand expression was reviewed.   Mother was given [facility] brochure and reviewed Breastfeeding Your NICU Baby.  Instruct mother in collection , storage and transporting breastmilk.   Mother was given more bottles for storage.  Patient Name: Michelle Lozano [id number] Date: 06/11/2019 Reason for consult: Initial assessment;NICU baby   Maternal Data Has patient been taught Hand Expression?: Yes Does the patient have breastfeeding experience prior to this delivery?: Yes  Feeding Feeding Type: Breast Milk  LATCH Score                   Interventions Interventions: Hand express;DEBP  Lactation Tools Discussed/Used Pump Review: Setup, frequency, and cleaning;Milk Storage Initiated by:: Staff nurse Date initiated:: 06/10/19   Consult Status Consult Status: Follow-up Date: 06/12/19 Follow-up type: In-patient    Jess Barters [facility] 06/11/2019, 4:38 PM

## 2019-06-12 LAB — RH IG WORKUP (INCLUDES ABO/RH)
ABO/RH(D): O NEG
Fetal Screen: NEGATIVE
Gestational Age(Wks): 39
Unit division: 0

## 2019-06-12 NOTE — Lactation Note (Signed)
This note was copied from a baby's chart. Lactation Consultation Note  Patient Name: Michelle Lozano Date: 06/12/2019   Attempted to visit with mother but she wasn't in the room, she was in the NICU with her baby. Spoke to [employer] and she told LC that mom is already pumping every 3 hours. Baby is currently on breast milk, mom has also been given extra containers for storage. Asked RN to page lactation if any other questions or concerns arise.  Maternal Data    Feeding Feeding Type: Breast Milk  LATCH Score                   Interventions    Lactation Tools Discussed/Used     Consult Status      Michelle Lozano 06/12/2019, 1:36 PM

## 2019-06-12 NOTE — Plan of Care (Signed)
Patient has progressed well.Walks frequently,voids without difficulty,passing gas,scant vaginal bleeding and good pain control.Pumping for breastfeeding is going well.

## 2019-06-12 NOTE — Progress Notes (Signed)
  Patient is eating, ambulating, voiding.  Pain control is good.  Vitals:   06/11/19 0800 06/11/19 1540 06/11/19 2321 06/12/19 0418  BP: (!) 106/59 (!) 113/57 114/72 126/82  Pulse: 62 71 61 65  Resp: 18 17  18   Temp: 98.5 F (36.9 C) 98.1 F (36.7 C) 97.8 F (36.6 C) 98.1 F (36.7 C)  TempSrc: Oral Oral Oral Oral  SpO2: 97% 98%  99%  Weight:      Height:        lungs:   clear to auscultation cor:    RRR Abdomen:  soft, appropriate tenderness, incisions intact and without erythema or exudate ex:    no cords   Lab Results  Component Value Date   WBC 16.5 (H) 06/11/2019   HGB 10.9 (L) 06/11/2019   HCT 35.0 (L) 06/11/2019   MCV 82.2 06/11/2019   PLT 309 06/11/2019    --/--/O NEG (07/25 0538)/RI  A/P    Post operative day 2.  Routine post op and postpartum care.  Expect d/c tomorrow if baby transferred from NICU.  Percocet for pain control.   Rhogam given.

## 2019-06-13 NOTE — Progress Notes (Signed)
CSW received consult due to score 10 on Edinburgh Depression Screen.  CSW will assess for NICU admission.   When CSW arrived, MOB was resting in bed and infant was in NICU. CSW explained CSW's role and invited MOB to ask questions while CSW completed the assessment. MOB was polite, easy going, and receptive to meeting with CSW.   CSW inquired about MOB's thoughts and feeling regarding infant's NICU admission. MOB reported that she feels good and stated, "I know that my baby is going to be ok." MOB shared that her anxiety comes from thinking about returning back to work and the pandemic.  CSW validated and normalized MOB's thoughts and feelings and processed natural interventions to decrease her anxiety.  MOB also shared that MOB has an active Rx  (Prozac) to manage her symptoms.  Per MOB, MOB's symptoms have been managed well.   CSW provided education regarding Baby Blues vs PMADs and provided MOB with resources for mental health follow up.  CSW encouraged MOB to evaluate her mental health throughout the postpartum period with the use of the New Mom Checklist developed by Postpartum Progress as well as the [place] Postnatal Depression Scale and notify a medical professional if symptoms arise.  CSW assessed for SI, HI, and DV; MOB denied all and did not demonstrate and any symptoms during the assessment.  MOB also shared that she has good support team that will be willing to assist as needed.  CSW will continue to provided resources and supports as needed while infant remains in NICU.   Laurey Arrow, MSW, LCSW Clinical Social Work [phone]

## 2019-06-13 NOTE — Progress Notes (Signed)
Subjective: Postpartum Day 3: Cesarean Delivery Patient reports pain controlled, no nausea or vomiting. Ambulating well    Objective: Vital signs in last 24 hours: Temp:  [97.9 F (36.6 C)-98.6 F (37 C)] 98.1 F (36.7 C) (07/27 0823) Pulse Rate:  [57-74] 74 (07/27 1523) Resp:  [17-18] 17 (07/27 0823) BP: (116-124)/(67-84) 116/73 (07/27 1523) SpO2:  [99 %-100 %] 100 % (07/27 7824)  Physical Exam:  General: alert, cooperative and appears stated age Lochia: appropriate Uterine Fundus: firm Incision: healing well DVT Evaluation: No evidence of DVT seen on physical exam.  Recent Labs    06/11/19 0538  HGB 10.9*  HCT 35.0*    Assessment/Plan: Status post Cesarean section. Doing well postoperatively.  Continue current care. D/C home tomorrow Baby in [place]  Vanessa Kick 06/13/2019, 7:19 PM

## 2019-06-13 NOTE — Lactation Note (Signed)
This note was copied from a baby's chart. Lactation Consultation Note  Patient Name: Michelle Lozano [id number] Date: 06/13/2019 Reason for consult: Follow-up assessment;NICU baby;Term;Other (Comment)(milk is in/ baby breast feeding and being tube fed) LC visited mom in NICU - 38 . As LC entered the room baby asleep on mom chest.  Per  Mom the baby fed 9 mins on the right and 8 mins on the left, swallows noted.  Per mom her milk came in within 24 hours of birth. Mom pointed to the refige in room and several bottles  Of EBM noted. [employer] praised mom for her efforts pumping and breast feeding.  LC noted baby is also getting tube fed.  LC recommended since her milk has come if the 1st breast is to full to express off the fullness by hand expressing  To take some of the pressure out of the breast and then latch and allow the baby to soften the 1st breast well prior to  Switching to the 2nd breast. If the baby only feeds 1st breast / post pump both breast for relief and prevention of engorgement.  Per mom has not experienced any engorgement since her milk has come in.  Per mom plans to pump due to feeling full.    Maternal Data    Feeding Feeding Type: (per mom baby recently fed 9 mins right and 8 mins left)  LATCH Score ( Latch Score by the NICU RN )  Latch: Grasps breast easily, tongue down, lips flanged, rhythmical sucking.  Audible Swallowing: Spontaneous and intermittent  Type of Nipple: Everted at rest and after stimulation  Comfort (Breast/Nipple): Soft / non-tender  Hold (Positioning): No assistance needed to correctly position infant at breast.  LATCH Score: 10  Interventions Interventions: Breast feeding basics reviewed;DEBP  Lactation Tools Discussed/Used Tools: Pump Breast pump type: Double-Electric Breast Pump   Consult Status Consult Status: Follow-up Date: 06/14/19 Follow-up type: In-patient    [place] 06/13/2019, 5:39 PM

## 2019-06-14 MED ORDER — IBUPROFEN 800 MG PO TABS: 800.0000 mg | ORAL_TABLET | Freq: Four times a day (QID) | ORAL | 0 refills | Status: DC

## 2019-06-14 NOTE — Progress Notes (Signed)
POD#4 Pt is in the NICU with baby. Per report she is doing well and the plan is for discharge today. VSSAF IMP/ Stable Plan/ Will discharge today

## 2019-06-14 NOTE — Lactation Note (Signed)
This note was copied from a baby's chart. Lactation Consultation Note  Patient Name: Michelle Lozano [id number] Date: 06/14/2019 Reason for consult: Follow-up assessment;NICU baby;Term;Other (Comment)(milk has been in at 24 hours) [place] visited mom on her room 102.  Per mom  Continues to breast feed and pump. Denies engorgement.  LC reviewed sore nipple and engorgement prevention and tx .  Mom has her DEBP for NICU and her DEBP for home.  LC encouraged mom to call the breast feeding phone line if questions .   Maternal Data    Feeding Feeding Type: Breast Fed  LATCH Score                   Interventions Interventions: Breast feeding basics reviewed;DEBP  Lactation Tools Discussed/Used Tools: Pump Breast pump type: Double-Electric Breast Pump Pump Review: Milk Storage(per mom has a good understanding of the set up of the DEBP)   Consult Status Consult Status: PRN Date: (baby is in NICU) Follow-up type: In-patient    [place] 06/14/2019, 9:29 AM

## 2019-06-14 NOTE — Discharge Summary (Signed)
Obstetric Discharge Summary Reason for Admission: cesarean section Prenatal Procedures: ultrasound Intrapartum Procedures: cesarean: low cervical, transverse Postpartum Procedures: none Complications-Operative and Postpartum: none Hemoglobin  Date Value Ref Range Status  06/11/2019 10.9 (L) 12.0 - 15.0 g/dL Final   HCT  Date Value Ref Range Status  06/11/2019 35.0 (L) 36.0 - 46.0 % Final    Physical Exam:  General: alert and cooperative Lochia: appropriate   Discharge Diagnoses: Term Pregnancy-delivered and history of c/s, declines VBAC and morbid obesity  Discharge Information: Date: 06/14/2019 Activity: pelvic rest Diet: routine Medications: PNV and Ibuprofen Condition: stable Instructions: refer to practice specific booklet Discharge to: home Follow-up Information    Jerelyn Charles, MD. Schedule an appointment as soon as possible for a visit in 1 month(s).   Specialty: Obstetrics Contact information: [address] [place] [address] [phone]           Newborn Data: Live born female  Birth Weight: 9 lb 0.3 oz (4090 g) APGAR: 6, 7  Newborn Delivery   Birth date/time: 06/10/2018 13:31:00 Delivery type:       Home with in NICU.  ANDERSON,MARK E 06/14/2019, 8:40 AM

## 2019-06-26 LAB — BIRTH TISSUE RECOVERY COLLECTION (PLACENTA DONATION)

## 2019-09-07 ENCOUNTER — Encounter (HOSPITAL_COMMUNITY): Payer: Self-pay

## 2020-12-06 DIAGNOSIS — Z20822 Contact with and (suspected) exposure to covid-19: Secondary | ICD-10-CM | POA: Diagnosis not present

## 2021-02-04 DIAGNOSIS — Z124 Encounter for screening for malignant neoplasm of cervix: Secondary | ICD-10-CM | POA: Diagnosis not present

## 2021-02-04 DIAGNOSIS — Z01419 Encounter for gynecological examination (general) (routine) without abnormal findings: Secondary | ICD-10-CM | POA: Diagnosis not present

## 2021-02-04 DIAGNOSIS — Z09 Encounter for follow-up examination after completed treatment for conditions other than malignant neoplasm: Secondary | ICD-10-CM | POA: Diagnosis not present

## 2021-02-04 DIAGNOSIS — Z87410 Personal history of cervical dysplasia: Secondary | ICD-10-CM | POA: Diagnosis not present

## 2021-02-19 ENCOUNTER — Encounter (INDEPENDENT_AMBULATORY_CARE_PROVIDER_SITE_OTHER): Payer: Self-pay | Admitting: Family Medicine

## 2021-02-19 ENCOUNTER — Other Ambulatory Visit: Payer: Self-pay

## 2021-02-19 ENCOUNTER — Ambulatory Visit (INDEPENDENT_AMBULATORY_CARE_PROVIDER_SITE_OTHER): Payer: BLUE CROSS/BLUE SHIELD | Admitting: Family Medicine

## 2021-02-19 VITALS — BP 113/74 | HR 72 | Temp 97.8°F | Ht 67.0 in | Wt 307.0 lb

## 2021-02-19 DIAGNOSIS — Z975 Presence of (intrauterine) contraceptive device: Secondary | ICD-10-CM

## 2021-02-19 DIAGNOSIS — Z7289 Other problems related to lifestyle: Secondary | ICD-10-CM | POA: Diagnosis not present

## 2021-02-19 DIAGNOSIS — R6889 Other general symptoms and signs: Secondary | ICD-10-CM | POA: Diagnosis not present

## 2021-02-19 DIAGNOSIS — F1090 Alcohol use, unspecified, uncomplicated: Secondary | ICD-10-CM

## 2021-02-19 DIAGNOSIS — R7303 Prediabetes: Secondary | ICD-10-CM | POA: Diagnosis not present

## 2021-02-19 DIAGNOSIS — E65 Localized adiposity: Secondary | ICD-10-CM

## 2021-02-19 DIAGNOSIS — Z6841 Body Mass Index (BMI) 40.0 and over, adult: Secondary | ICD-10-CM

## 2021-02-19 DIAGNOSIS — N644 Mastodynia: Secondary | ICD-10-CM

## 2021-02-19 DIAGNOSIS — F3289 Other specified depressive episodes: Secondary | ICD-10-CM | POA: Diagnosis not present

## 2021-02-19 DIAGNOSIS — R0602 Shortness of breath: Secondary | ICD-10-CM

## 2021-02-19 DIAGNOSIS — D539 Nutritional anemia, unspecified: Secondary | ICD-10-CM | POA: Diagnosis not present

## 2021-02-19 DIAGNOSIS — R5383 Other fatigue: Secondary | ICD-10-CM | POA: Diagnosis not present

## 2021-02-19 DIAGNOSIS — R0683 Snoring: Secondary | ICD-10-CM

## 2021-02-19 DIAGNOSIS — Z9189 Other specified personal risk factors, not elsewhere classified: Secondary | ICD-10-CM

## 2021-02-19 DIAGNOSIS — F411 Generalized anxiety disorder: Secondary | ICD-10-CM

## 2021-02-19 DIAGNOSIS — Z0289 Encounter for other administrative examinations: Secondary | ICD-10-CM

## 2021-02-19 MED ORDER — FLUOXETINE HCL 20 MG PO TABS
20.0000 mg | ORAL_TABLET | Freq: Every day | ORAL | 0 refills | Status: DC
Start: 2021-02-19 — End: 2021-03-19

## 2021-02-19 MED ORDER — METFORMIN HCL 500 MG PO TABS: 500.0000 mg | ORAL_TABLET | Freq: Every day | ORAL | 0 refills | Status: DC

## 2021-02-20 ENCOUNTER — Ambulatory Visit (INDEPENDENT_AMBULATORY_CARE_PROVIDER_SITE_OTHER): Payer: 59 | Admitting: Bariatrics

## 2021-02-20 LAB — CBC WITH DIFFERENTIAL/PLATELET
Basophils Absolute: 0.1 10*3/uL (ref 0.0–0.2)
Basos: 1 %
EOS (ABSOLUTE): 0.1 10*3/uL (ref 0.0–0.4)
Eos: 1 %
Hemoglobin: 11.2 g/dL (ref 11.1–15.9)
Immature Grans (Abs): 0 10*3/uL (ref 0.0–0.1)
Immature Granulocytes: 0 %
Lymphocytes Absolute: 2.4 10*3/uL (ref 0.7–3.1)
Lymphs: 28 %
MCH: 25.1 pg — ABNORMAL LOW (ref 26.6–33.0)
MCHC: 31.3 g/dL — ABNORMAL LOW (ref 31.5–35.7)
MCV: 80 fL (ref 79–97)
Monocytes Absolute: 0.5 10*3/uL (ref 0.1–0.9)
Monocytes: 6 %
Neutrophils Absolute: 5.5 10*3/uL (ref 1.4–7.0)
Neutrophils: 64 %
Platelets: 441 10*3/uL (ref 150–450)
RBC: 4.46 x10E6/uL (ref 3.77–5.28)
RDW: 14.1 % (ref 11.7–15.4)
WBC: 8.6 10*3/uL (ref 3.4–10.8)

## 2021-02-20 LAB — COMPREHENSIVE METABOLIC PANEL
ALT: 13 IU/L (ref 0–32)
AST: 16 IU/L (ref 0–40)
Albumin/Globulin Ratio: 1.8 (ref 1.2–2.2)
Albumin: 4.4 g/dL (ref 3.8–4.8)
Alkaline Phosphatase: 68 IU/L (ref 44–121)
BUN/Creatinine Ratio: 11 (ref 9–23)
BUN: 8 mg/dL (ref 6–20)
Bilirubin Total: 0.3 mg/dL (ref 0.0–1.2)
CO2: 22 mmol/L (ref 20–29)
Calcium: 9 mg/dL (ref 8.7–10.2)
Chloride: 102 mmol/L (ref 96–106)
Creatinine, Ser: 0.71 mg/dL (ref 0.57–1.00)
Globulin, Total: 2.5 g/dL (ref 1.5–4.5)
Glucose: 85 mg/dL (ref 65–99)
Potassium: 4.3 mmol/L (ref 3.5–5.2)
Sodium: 138 mmol/L (ref 134–144)
Total Protein: 6.9 g/dL (ref 6.0–8.5)
eGFR: 114 mL/min/{1.73_m2} (ref >59)

## 2021-02-20 LAB — LIPID PANEL
Chol/HDL Ratio: 3 ratio (ref 0.0–4.4)
Cholesterol, Total: 150 mg/dL (ref 100–199)
HDL: 50 mg/dL (ref >39)
LDL Chol Calc (NIH): 85 mg/dL (ref 0–99)
Triglycerides: 77 mg/dL (ref 0–149)
VLDL Cholesterol Cal: 15 mg/dL (ref 5–40)

## 2021-02-20 LAB — T4, FREE: Free T4: 1.08 ng/dL (ref 0.82–1.77)

## 2021-02-20 LAB — ANEMIA PANEL
Ferritin: 8 ng/mL — ABNORMAL LOW (ref 15–150)
Folate, Hemolysate: 346 ng/mL
Folate, RBC: 966 ng/mL (ref >498)
Hematocrit: 35.8 % (ref 34.0–46.6)
Iron Saturation: 9 % — CL (ref 15–55)
Iron: 37 ug/dL (ref 27–159)
Retic Ct Pct: 1.3 % (ref 0.6–2.6)
Total Iron Binding Capacity: 425 ug/dL (ref 250–450)
UIBC: 388 ug/dL (ref 131–425)
Vitamin B-12: 561 pg/mL (ref 232–1245)

## 2021-02-20 LAB — TSH: TSH: 2.79 u[IU]/mL (ref 0.450–4.500)

## 2021-02-20 LAB — INSULIN, RANDOM: INSULIN: 13.5 u[IU]/mL (ref 2.6–24.9)

## 2021-02-20 LAB — HEMOGLOBIN A1C
Est. average glucose Bld gHb Est-mCnc: 117 mg/dL
Hgb A1c MFr Bld: 5.7 % — ABNORMAL HIGH (ref 4.8–5.6)

## 2021-02-20 LAB — VITAMIN D 25 HYDROXY (VIT D DEFICIENCY, FRACTURES): Vit D, 25-Hydroxy: 30 ng/mL (ref 30.0–100.0)

## 2021-02-21 ENCOUNTER — Encounter (INDEPENDENT_AMBULATORY_CARE_PROVIDER_SITE_OTHER): Payer: Self-pay | Admitting: Family Medicine

## 2021-02-27 NOTE — Progress Notes (Signed)
Chief Complaint:   OBESITY Michelle Lozano (MR# [id number]) is a 35 y.o. female who presents for evaluation and treatment of obesity and related comorbidities. Current BMI is Body mass index is 48.08 kg/m. Michelle Lozano has been struggling with her weight for many years and has been unsuccessful in either losing weight, maintaining weight loss, or reaching her healthy weight goal.  Michelle Lozano is currently in the action stage of change and ready to dedicate time achieving and maintaining a healthier weight. Michelle Lozano is interested in becoming our patient and working on intensive lifestyle modifications including (but not limited to) diet and exercise for weight loss.  Michelle Lozano works in [employer].  She works 40 hours per week.  She is married and lives with her husband and 2 sons (1-3).  She gets 6,000-10,000 steps per day.  She drinks 2-3 glasses of beer or wine 5 times per week.  Her weight is 180 pounds.  Michelle Lozano's habits were reviewed today and are as follows: Her family eats meals together, her desired weight loss is 128 pounds, she started gaining weight at age 73, her heaviest weight ever was 315 pounds, she craves carbs, sugar, and bread, she skips breakfast frequently, she is frequently drinking liquids with calories, she frequently eats larger portions than normal and she struggles with emotional eating.  Depression Screen [place] Food and Mood (modified PHQ-9) score was 3.  Depression screen [facility] 2/9 02/19/2021  Decreased Interest 0  Down, Depressed, Hopeless 0  PHQ - 2 Score 0  Altered sleeping 0  Tired, decreased energy 1  Change in appetite 1  Feeling bad or failure about yourself  0  Trouble concentrating 1  Moving slowly or fidgety/restless 0  Suicidal thoughts 0  PHQ-9 Score 3  Difficult doing work/chores Not difficult at all   Assessment/Plan:   1. Other fatigue Michelle Lozano denies daytime somnolence and admits to waking up still tired. Patent has a history of symptoms of  morning fatigue and snoring. Michelle Lozano generally gets 6-8 hours of sleep per night, and states that she has generally restful sleep. Snoring is present. Apneic episodes are not present. Epworth Sleepiness Score is 5.  Michelle Lozano does feel that her weight is causing her energy to be lower than it should be. Fatigue may be related to obesity, depression or many other causes. Labs will be ordered, and in the meanwhile, Michelle Lozano will focus on self care including making healthy food choices, increasing physical activity and focusing on stress reduction.  - EKG 12-Lead - Anemia panel - CBC with Differential/Platelet - Comprehensive metabolic panel - Hemoglobin A1c - Insulin, random - Lipid panel - VITAMIN D 25 Hydroxy (Vit-D Deficiency, Fractures) - TSH - T4, free  2. SOB (shortness of breath) on exertion Michelle Lozano notes increasing shortness of breath with exercising and seems to be worsening over time with weight gain. She notes getting out of breath sooner with activity than she used to. This has gotten worse recently. Michelle Lozano denies shortness of breath at rest or orthopnea.  Michelle Lozano does feel that she gets out of breath more easily that she used to when she exercises. Michelle Lozano's shortness of breath appears to be obesity related and exercise induced. She has agreed to work on weight loss and gradually increase exercise to treat her exercise induced shortness of breath. Will continue to monitor closely.  - Anemia panel - CBC with Differential/Platelet - Comprehensive metabolic panel - Hemoglobin A1c - Insulin, random - Lipid panel - VITAMIN D 25 Hydroxy (Vit-D Deficiency, Fractures) -  TSH - T4, free  3. Visceral obesity Current visceral fat rating: 16. Visceral fat rating should be < 13. Visceral adipose tissue is a hormonally active component of total body fat. This body composition phenotype is associated with medical disorders such as metabolic syndrome, cardiovascular disease and several  malignancies including prostate, breast, and colorectal cancers. Starting goal: Lose 7-10% of starting weight.   - CBC with Differential/Platelet - Comprehensive metabolic panel - Lipid panel - VITAMIN D 25 Hydroxy (Vit-D Deficiency, Fractures) - TSH - T4, free  4. Prediabetes Improving, but not optimized. Goal is HgbA1c < 5.7.  Medication:  None.  She was previously on metformin and says it helped her.  Plan:  She will continue to focus on protein-rich, low simple carbohydrate foods. We reviewed the importance of hydration, regular exercise for stress reduction, and restorative sleep.  Will check labs today.  Start metformin 500 mg daily, as per below.  Lab Results  Component Value Date   HGBA1C 5.7 (H) 02/19/2021   Lab Results  Component Value Date   INSULIN 13.5 02/19/2021   - Comprehensive metabolic panel - Hemoglobin A1c - Insulin, random - Start metFORMIN (GLUCOPHAGE) 500 MG tablet; Take 1 tablet (500 mg total) by mouth daily with breakfast.  Dispense: 30 tablet; Refill: 0  5. IUD (intrauterine device) in place Michelle Lozano has an IUD in place for birth control.  6. Mastalgia Michelle Lozano wants to get a breast reduction.  She is still breastfeeding.  She does not want anymore children.  7. Alcohol drinking problem Michelle Lozano is drinking beer or wine 2-3 glasses 5 times per week.  - Comprehensive metabolic panel  8. Snores Michelle Lozano endorses snoring.  Epworth sleepiness score is 5.  9. GAD (generalized anxiety disorder) Michelle Lozano has symptoms of GAD.   Plan:  Start Prozac 20 mg daily, as per below.  - Start FLUoxetine (PROZAC) 20 MG tablet; Take 1 tablet (20 mg total) by mouth daily.  Dispense: 30 tablet; Refill: 0  10. Other depression, with emotional eating Not at goal. Medication: None.  Plan:  Behavior modification techniques were discussed today to help deal with emotional/non-hunger eating behaviors.  11. At risk for diabetes mellitus Michelle Lozano was given diabetes  prevention education and counseling today of more than 9 minutes. She will focus on protein-rich, low simple carbohydrate foods. We reviewed the importance of hydration, regular exercise for stress reduction, and restorative sleep.   12. Class 3 severe obesity with serious comorbidity and body mass index (BMI) of 45.0 to 49.9 in adult, unspecified obesity type Michelle Lozano)  Michelle Lozano is currently in the action stage of change and her goal is to continue with weight loss efforts. I recommend Michelle Lozano begin the structured treatment plan as follows:  She has agreed to the Category 4 Plan.  Exercise goals: As is.   Behavioral modification strategies: increasing lean protein intake, decreasing simple carbohydrates, increasing vegetables, increasing water intake, decreasing liquid calories, decreasing alcohol intake, decreasing sodium intake and increasing high fiber foods.  She was informed of the importance of frequent follow-up visits to maximize her success with intensive lifestyle modifications for her multiple health conditions. She was informed we would discuss her lab results at her next visit unless there is a critical issue that needs to be addressed sooner. Michelle Lozano agreed to keep her next visit at the agreed upon time to discuss these results.  Objective:   Blood pressure 113/74, pulse 72, temperature 97.8 F (36.6 C), temperature source Oral, height 5\' 7"  (1.702 m), weight )  307 lb (139.3 kg), SpO2 99 %, unknown if currently breastfeeding. Body mass index is 48.08 kg/m.  EKG: Normal sinus rhythm, rate 68 bpm.  Indirect Calorimeter completed today shows a VO2 of 345 and a REE of 2404.  Her calculated basal metabolic rate is 4332 thus her basal metabolic rate is better than expected.  General: Cooperative, alert, well developed, in no acute distress. HEENT: Conjunctivae and lids unremarkable. Cardiovascular: Regular rhythm.  Lungs: Normal work of breathing. Neurologic: No focal deficits.    Lab Results  Component Value Date   CREATININE 0.71 02/19/2021   BUN 8 02/19/2021   NA 138 02/19/2021   K 4.3 02/19/2021   CL 102 02/19/2021   CO2 22 02/19/2021   Lab Results  Component Value Date   ALT 13 02/19/2021   AST 16 02/19/2021   ALKPHOS 68 02/19/2021   BILITOT 0.3 02/19/2021   Lab Results  Component Value Date   HGBA1C 5.7 (H) 02/19/2021   Lab Results  Component Value Date   INSULIN 13.5 02/19/2021   Lab Results  Component Value Date   TSH 2.790 02/19/2021   Lab Results  Component Value Date   CHOL 150 02/19/2021   HDL 50 02/19/2021   LDLCALC 85 02/19/2021   TRIG 77 02/19/2021   CHOLHDL 3.0 02/19/2021   Lab Results  Component Value Date   WBC 8.6 02/19/2021   HGB 11.2 02/19/2021   HCT 35.8 02/19/2021   MCV 80 02/19/2021   PLT 441 02/19/2021   Lab Results  Component Value Date   IRON 37 02/19/2021   TIBC 425 02/19/2021   FERRITIN 8 (L) 02/19/2021   Attestation Statements:   This is the patient's first visit at [facility]. The patient's NEW PATIENT PACKET was reviewed at length. Included in the packet: current and past health history, medications, allergies, ROS, gynecologic history (women only), surgical history, family history, social history, weight history, weight loss surgery history (for those that have had weight loss surgery), nutritional evaluation, mood and food questionnaire, PHQ9, Epworth questionnaire, sleep habits questionnaire, patient life and health improvement goals questionnaire. These will all be scanned into the patient's chart under media.   During the visit, I independently reviewed the patient's EKG, bioimpedance scale results, and indirect calorimeter results. I used this information to tailor a meal plan for the patient that will help her to lose weight and will improve her obesity-related conditions going forward. I performed a medically necessary appropriate examination and/or evaluation. I discussed the  assessment and treatment plan with the patient. The patient was provided an opportunity to ask questions and all were answered. The patient agreed with the plan and demonstrated an understanding of the instructions. Labs were ordered at this visit and will be reviewed at the next visit unless more critical results need to be addressed immediately. Clinical information was updated and documented in the EMR.   I, Insurance claims handler, CMA, am acting as transcriptionist for Helane Rima, DO  I have reviewed the above documentation for accuracy and completeness, and I agree with the above. Helane Rima, DO

## 2021-03-05 ENCOUNTER — Ambulatory Visit (INDEPENDENT_AMBULATORY_CARE_PROVIDER_SITE_OTHER): Payer: BLUE CROSS/BLUE SHIELD | Admitting: Family Medicine

## 2021-03-19 ENCOUNTER — Ambulatory Visit (INDEPENDENT_AMBULATORY_CARE_PROVIDER_SITE_OTHER): Payer: BLUE CROSS/BLUE SHIELD | Admitting: Family Medicine

## 2021-03-19 ENCOUNTER — Encounter (INDEPENDENT_AMBULATORY_CARE_PROVIDER_SITE_OTHER): Payer: Self-pay | Admitting: Family Medicine

## 2021-03-19 ENCOUNTER — Other Ambulatory Visit: Payer: Self-pay

## 2021-03-19 VITALS — BP 116/76 | HR 79 | Temp 98.0°F | Ht 67.0 in | Wt 299.0 lb

## 2021-03-19 DIAGNOSIS — E559 Vitamin D deficiency, unspecified: Secondary | ICD-10-CM

## 2021-03-19 DIAGNOSIS — R790 Abnormal level of blood mineral: Secondary | ICD-10-CM

## 2021-03-19 DIAGNOSIS — Z6841 Body Mass Index (BMI) 40.0 and over, adult: Secondary | ICD-10-CM

## 2021-03-19 DIAGNOSIS — Z9189 Other specified personal risk factors, not elsewhere classified: Secondary | ICD-10-CM | POA: Diagnosis not present

## 2021-03-19 DIAGNOSIS — F411 Generalized anxiety disorder: Secondary | ICD-10-CM

## 2021-03-19 DIAGNOSIS — R7303 Prediabetes: Secondary | ICD-10-CM

## 2021-03-19 MED ORDER — FLUOXETINE HCL 20 MG PO TABS: 20.0000 mg | ORAL_TABLET | Freq: Every day | ORAL | 0 refills | Status: DC

## 2021-03-19 MED ORDER — METFORMIN HCL 500 MG PO TABS: 500.0000 mg | ORAL_TABLET | Freq: Two times a day (BID) | ORAL | 1 refills | Status: DC

## 2021-03-19 NOTE — Progress Notes (Signed)
Chief Complaint:   OBESITY Michelle Lozano is here to discuss her progress with her obesity treatment plan along with follow-up of her obesity related diagnoses.   Today's visit was #: 2 Starting weight: 307 lbs Starting date: 02/19/2021 Today's weight: 299 lbs Today's date: 03/19/2021 Total lbs lost to date: 8 lbs Body mass index is 46.83 kg/m.  Total weight loss percentage to date: -2.61%  Interim History:  Michelle Lozano has been drinking less ETOH (only on golf course - 2 weeks ago).  Diet:  Needs to cut out more carbs.  She is tracking breakfast and lunch.  She has been craving salty carbs. Says she cannot say no to sweets in the break room.  She is still breastfeeding.   Current Meal Plan: the Category 4 Plan for 60-70% of the time.  Current Exercise Plan: Increased walking. Current Anti-Obesity Medications: metformin 500 mg twice daily. Side effects: None.  Assessment/Plan:   Diagnoses and all orders for this visit:  Low serum ferritin level, with borderline anemia Comments: Encouraged to restart PNV.  Prediabetes Comments: Improving. Increase Metformin to BID. Orders: -     metFORMIN (GLUCOPHAGE) 500 MG tablet; Take 1 tablet (500 mg total) by mouth 2 (two) times daily with a meal.  Vitamin D deficiency Comments: Discussed OTC supplemenation while breastfeeding.  GAD (generalized anxiety disorder) Comments: Improving. Continue Prozac. Orders: -     FLUoxetine (PROZAC) 20 MG tablet; Take 1 tablet (20 mg total) by mouth daily.  IUD (intrauterine device) in place  Class 3 severe obesity with serious comorbidity and body mass index (BMI) of 45.0 to 49.9 in adult, unspecified obesity type (HCC)  Course: Michelle Lozano is currently in the action stage of change. As such, her goal is to continue with weight loss efforts.   Nutrition goals: She has agreed to the Category 4 Plan.   Exercise goals: For substantial health benefits, adults should do at least 150 minutes (2 hours and  30 minutes) a week of moderate-intensity, or 75 minutes (1 hour and 15 minutes) a week of vigorous-intensity aerobic physical activity, or an equivalent combination of moderate- and vigorous-intensity aerobic activity. Aerobic activity should be performed in episodes of at least 10 minutes, and preferably, it should be spread throughout the week.  Behavioral modification strategies: increasing lean protein intake, decreasing simple carbohydrates, increasing vegetables, decreasing alcohol intake and better snacking choices.  Michelle Lozano has agreed to follow-up with our clinic in 4 weeks. She was informed of the importance of frequent follow-up visits to maximize her success with intensive lifestyle modifications for her multiple health conditions.   Objective:   Blood pressure 116/76, pulse 79, temperature 98 F (36.7 C), temperature source Oral, height 5\' 7"  (1.702 m), weight 299 lb (135.6 kg), SpO2 98 %, unknown if currently breastfeeding. Body mass index is 46.83 kg/m.  General: Cooperative, alert, well developed, in no acute distress. HEENT: Conjunctivae and lids unremarkable. Cardiovascular: Regular rhythm.  Lungs: Normal work of breathing. Neurologic: No focal deficits.   Lab Results  Component Value Date   CREATININE 0.71 02/19/2021   BUN 8 02/19/2021   NA 138 02/19/2021   K 4.3 02/19/2021   CL 102 02/19/2021   CO2 22 02/19/2021   Lab Results  Component Value Date   ALT 13 02/19/2021   AST 16 02/19/2021   ALKPHOS 68 02/19/2021   BILITOT 0.3 02/19/2021   Lab Results  Component Value Date   HGBA1C 5.7 (H) 02/19/2021   Lab Results  Component Value  Date   INSULIN 13.5 02/19/2021   Lab Results  Component Value Date   TSH 2.790 02/19/2021   Lab Results  Component Value Date   CHOL 150 02/19/2021   HDL 50 02/19/2021   LDLCALC 85 02/19/2021   TRIG 77 02/19/2021   CHOLHDL 3.0 02/19/2021   Lab Results  Component Value Date   WBC 8.6 02/19/2021   HGB 11.2 02/19/2021    HCT 35.8 02/19/2021   MCV 80 02/19/2021   PLT 441 02/19/2021   Lab Results  Component Value Date   IRON 37 02/19/2021   TIBC 425 02/19/2021   FERRITIN 8 (L) 02/19/2021   Attestation Statements:   Reviewed by clinician on day of visit: allergies, medications, problem list, medical history, surgical history, family history, social history, and previous encounter notes.  I, Insurance claims handler, CMA, am acting as transcriptionist for Helane Rima, DO  I have reviewed the above documentation for accuracy and completeness, and I agree with the above. Helane Rima, DO

## 2021-03-25 ENCOUNTER — Encounter (INDEPENDENT_AMBULATORY_CARE_PROVIDER_SITE_OTHER): Payer: Self-pay | Admitting: Family Medicine

## 2021-03-27 ENCOUNTER — Encounter (INDEPENDENT_AMBULATORY_CARE_PROVIDER_SITE_OTHER): Payer: Self-pay | Admitting: Family Medicine

## 2021-03-28 MED ORDER — TOPIRAMATE 25 MG PO TABS
25.0000 mg | ORAL_TABLET | Freq: Every day | ORAL | 0 refills | Status: DC
Start: 2021-03-28 — End: 2021-05-01

## 2021-03-28 MED ORDER — PHENTERMINE HCL 15 MG PO CAPS: 15.0000 mg | ORAL_CAPSULE | ORAL | 0 refills | Status: DC

## 2021-04-18 ENCOUNTER — Ambulatory Visit (INDEPENDENT_AMBULATORY_CARE_PROVIDER_SITE_OTHER): Payer: BLUE CROSS/BLUE SHIELD | Admitting: Family Medicine

## 2021-04-27 ENCOUNTER — Other Ambulatory Visit (INDEPENDENT_AMBULATORY_CARE_PROVIDER_SITE_OTHER): Payer: Self-pay | Admitting: Family Medicine

## 2021-04-27 DIAGNOSIS — R7303 Prediabetes: Secondary | ICD-10-CM

## 2021-04-29 NOTE — Telephone Encounter (Signed)
Dr.Wallace °

## 2021-05-08 ENCOUNTER — Other Ambulatory Visit: Payer: Self-pay

## 2021-05-08 ENCOUNTER — Encounter (INDEPENDENT_AMBULATORY_CARE_PROVIDER_SITE_OTHER): Payer: Self-pay | Admitting: Family Medicine

## 2021-05-08 ENCOUNTER — Ambulatory Visit (INDEPENDENT_AMBULATORY_CARE_PROVIDER_SITE_OTHER): Payer: BLUE CROSS/BLUE SHIELD | Admitting: Family Medicine

## 2021-05-08 VITALS — BP 115/75 | HR 79 | Temp 98.0°F | Ht 67.0 in | Wt 288.0 lb

## 2021-05-08 DIAGNOSIS — Z6841 Body Mass Index (BMI) 40.0 and over, adult: Secondary | ICD-10-CM

## 2021-05-08 DIAGNOSIS — F902 Attention-deficit hyperactivity disorder, combined type: Secondary | ICD-10-CM | POA: Diagnosis not present

## 2021-05-08 DIAGNOSIS — Z9189 Other specified personal risk factors, not elsewhere classified: Secondary | ICD-10-CM

## 2021-05-08 DIAGNOSIS — F5081 Binge eating disorder: Secondary | ICD-10-CM

## 2021-05-08 DIAGNOSIS — R7303 Prediabetes: Secondary | ICD-10-CM

## 2021-05-08 MED ORDER — LISDEXAMFETAMINE DIMESYLATE 30 MG PO CAPS: 30.0000 mg | ORAL_CAPSULE | Freq: Every day | ORAL | 0 refills | Status: DC

## 2021-05-08 NOTE — Progress Notes (Signed)
Chief Complaint:   OBESITY Michelle Lozano is here to discuss her progress with her obesity treatment plan along with follow-up of her obesity related diagnoses. See Medical Weight Management Flowsheet for complete bioelectrical impedance results.  Today's visit was #: 3 Starting weight: 307 lbs Starting date: 02/19/2021 Today's weight: 288 lbs Today's date: 05/08/2021 Weight change since last visit: 11 lbs Total lbs lost to date: 19 lbs Body mass index is 45.11 kg/m.  Total weight loss percentage to date: -6.19%  Interim History:  Michelle Lozano says that the medications are helping greatly.  She has decreased appetite and cravings.  She notices mild improvement in focus.  Suspects ADHD since childhood.  ETOH - only when playing golf or at the pool.  Nutrition Plan: the Category 4 Plan for 0% of the time. Activity:  Golfing/walking 8,000 steps/swimming 5-7 times per week.  Assessment/Plan:   1. Prediabetes At goal. Goal is HgbA1c < 5.7.  Medication: metformin 500 mg twice daily.    Plan:  Continue metformin.  Will refill today.  She will continue to focus on protein-rich, low simple carbohydrate foods. We reviewed the importance of hydration, regular exercise for stress reduction, and restorative sleep.   Lab Results  Component Value Date   HGBA1C 5.7 (H) 02/19/2021   Lab Results  Component Value Date   INSULIN 13.5 02/19/2021   - Refill metFORMIN (GLUCOPHAGE) 500 MG tablet; Take 1 tablet (500 mg total) by mouth 2 (two) times daily with a meal.  Dispense: 60 tablet; Refill: 1  2. Attention deficit hyperactivity disorder (ADHD), combined type Will refer Michelle Lozano to [facility] for evaluation and treatment of ADHD.  3. Binge eating disorder Will start Michelle Lozano on Vyvanse 30 mg daily for BED.  People who binge eat feel as if they don't have control over how much they eat and have feelings of guilt or self-loathing after a binge eating episode. [name]  estimates that about 30 percent of adults with binge eating disorder also have a history of ADHD. The FDA has approved Vyvanse as a treatment option for both ADHD and binge eating. Vyvanse targets the brain's reward center by increasing the levels of dopamine and norepinephrine, the chemicals of the brain responsible for feelings of pleasure. Mindful eating is the recommended nutritional approach to treating BED.   I have consulted the [place] Controlled Substances Registry for this patient, and feel the risk/benefit ratio today is favorable for proceeding with this prescription for a controlled substance. The patient understands monitoring parameters and red flags.   - Start lisdexamfetamine (VYVANSE) 30 MG capsule; Take 1 capsule (30 mg total) by mouth daily.  Dispense: 30 capsule; Refill: 0  4. At risk for diabetes mellitus Michelle Lozano was given diabetes prevention education and counseling today of more than 8 minutes. During insulin resistance, several metabolic alterations induce the development of cardiovascular disease. For instance, insulin resistance can induce an imbalance in glucose metabolism that generates chronic hyperglycemia, which in turn triggers oxidative stress and causes an inflammatory response that leads to cell damage. Insulin resistance can also alter systemic lipid metabolism which then leads to the development of dyslipidemia and the well-known lipid triad: (1) high levels of plasma triglycerides, (2) low levels of high-density lipoprotein, and (3) the appearance of small dense low-density lipoproteins. This triad, along with endothelial dysfunction, which can also be induced by aberrant insulin signaling, contribute to atherosclerotic plaque formation.   5. Obesity, current BMI 45.1  Course: Michelle Lozano is currently in the  action stage of change. As such, her goal is to continue with weight loss efforts.   Nutrition goals: She has agreed to practicing portion control and making smarter  food choices, such as increasing vegetables and decreasing simple carbohydrates.   Exercise goals: For substantial health benefits, adults should do at least 150 minutes (2 hours and 30 minutes) a week of moderate-intensity, or 75 minutes (1 hour and 15 minutes) a week of vigorous-intensity aerobic physical activity, or an equivalent combination of moderate- and vigorous-intensity aerobic activity. Aerobic activity should be performed in episodes of at least 10 minutes, and preferably, it should be spread throughout the week.  Behavioral modification strategies: increasing lean protein intake, decreasing simple carbohydrates, increasing vegetables, and increasing water intake.  Michelle Lozano has agreed to follow-up with our clinic in 4 weeks. She was informed of the importance of frequent follow-up visits to maximize her success with intensive lifestyle modifications for her multiple health conditions.   Objective:   Blood pressure 115/75, pulse 79, temperature 98 F (36.7 C), temperature source Oral, height 5\' 7"  (1.702 m), weight 288 lb (130.6 kg), SpO2 99 %, unknown if currently breastfeeding. Body mass index is 45.11 kg/m.  General: Cooperative, alert, well developed, in no acute distress. HEENT: Conjunctivae and lids unremarkable. Cardiovascular: Regular rhythm.  Lungs: Normal work of breathing. Neurologic: No focal deficits.   Lab Results  Component Value Date   CREATININE 0.71 02/19/2021   BUN 8 02/19/2021   NA 138 02/19/2021   K 4.3 02/19/2021   CL 102 02/19/2021   CO2 22 02/19/2021   Lab Results  Component Value Date   ALT 13 02/19/2021   AST 16 02/19/2021   ALKPHOS 68 02/19/2021   BILITOT 0.3 02/19/2021   Lab Results  Component Value Date   HGBA1C 5.7 (H) 02/19/2021   Lab Results  Component Value Date   INSULIN 13.5 02/19/2021   Lab Results  Component Value Date   TSH 2.790 02/19/2021   Lab Results  Component Value Date   CHOL 150 02/19/2021   HDL 50  02/19/2021   LDLCALC 85 02/19/2021   TRIG 77 02/19/2021   CHOLHDL 3.0 02/19/2021   Lab Results  Component Value Date   WBC 8.6 02/19/2021   HGB 11.2 02/19/2021   HCT 35.8 02/19/2021   MCV 80 02/19/2021   PLT 441 02/19/2021   Lab Results  Component Value Date   IRON 37 02/19/2021   TIBC 425 02/19/2021   FERRITIN 8 (L) 02/19/2021   Attestation Statements:   Reviewed by clinician on day of visit: allergies, medications, problem list, medical history, surgical history, family history, social history, and previous encounter notes.  I, 04/21/2021, CMA, am acting as transcriptionist for Insurance claims handler, DO  I have reviewed the above documentation for accuracy and completeness, and I agree with the above. Helane Rima, DO

## 2021-05-09 MED ORDER — METFORMIN HCL 500 MG PO TABS: 500.0000 mg | ORAL_TABLET | Freq: Two times a day (BID) | ORAL | 1 refills | Status: DC

## 2021-06-07 ENCOUNTER — Other Ambulatory Visit (INDEPENDENT_AMBULATORY_CARE_PROVIDER_SITE_OTHER): Payer: Self-pay | Admitting: Family Medicine

## 2021-06-07 DIAGNOSIS — F411 Generalized anxiety disorder: Secondary | ICD-10-CM

## 2021-06-10 NOTE — Telephone Encounter (Signed)
Dr.Wallace °

## 2021-06-11 ENCOUNTER — Encounter (INDEPENDENT_AMBULATORY_CARE_PROVIDER_SITE_OTHER): Payer: Self-pay

## 2021-06-11 NOTE — Telephone Encounter (Signed)
Msg sent to pt 

## 2021-06-17 ENCOUNTER — Encounter (INDEPENDENT_AMBULATORY_CARE_PROVIDER_SITE_OTHER): Payer: Self-pay | Admitting: Family Medicine

## 2021-06-17 ENCOUNTER — Ambulatory Visit (INDEPENDENT_AMBULATORY_CARE_PROVIDER_SITE_OTHER): Payer: BLUE CROSS/BLUE SHIELD | Admitting: Family Medicine

## 2021-06-17 ENCOUNTER — Other Ambulatory Visit: Payer: Self-pay

## 2021-06-17 VITALS — BP 105/71 | HR 82 | Temp 98.3°F | Ht 67.0 in | Wt 279.0 lb

## 2021-06-17 DIAGNOSIS — R790 Abnormal level of blood mineral: Secondary | ICD-10-CM

## 2021-06-17 DIAGNOSIS — Z6841 Body Mass Index (BMI) 40.0 and over, adult: Secondary | ICD-10-CM

## 2021-06-17 DIAGNOSIS — R7303 Prediabetes: Secondary | ICD-10-CM | POA: Diagnosis not present

## 2021-06-17 DIAGNOSIS — F5081 Binge eating disorder: Secondary | ICD-10-CM | POA: Diagnosis not present

## 2021-06-17 DIAGNOSIS — Z9189 Other specified personal risk factors, not elsewhere classified: Secondary | ICD-10-CM

## 2021-06-17 MED ORDER — LISDEXAMFETAMINE DIMESYLATE 40 MG PO CAPS: 40.0000 mg | ORAL_CAPSULE | Freq: Every morning | ORAL | 0 refills | Status: DC

## 2021-06-17 MED ORDER — TOPIRAMATE 25 MG PO TABS
25.0000 mg | ORAL_TABLET | Freq: Every day | ORAL | 0 refills | Status: DC
Start: 2021-06-17 — End: 2021-08-27

## 2021-06-19 NOTE — Progress Notes (Signed)
Chief Complaint:   OBESITY Michelle Lozano is here to discuss her progress with her obesity treatment plan along with follow-up of her obesity related diagnoses.   Today's visit was #: 4 Starting weight: 307 lbs Starting date: 02/19/2021 Today's weight: 279 lbs Today's date: 06/17/2021 Weight change since last visit: 9 lbs Total lbs lost to date: 28 lbs Body mass index is 43.7 kg/m.  Total weight loss percentage to date: -9.12%  Interim History:  [name] has been out of Vyvanse for about 1 week.  She felt that she still did well regarding food choices and exercise.  She even had a beach trip since our last visit.  Current Meal Plan: practicing portion control and making smarter food choices, such as increasing vegetables and decreasing simple carbohydrates for 0% of the time.  Current Exercise Plan: Walking for 20 minutes 3 times per week.  Assessment/Plan:   1. Prediabetes At goal. Goal is HgbA1c < 5.7.  Medication: metformin 500 mg twice daily.    Plan:  She will continue to focus on protein-rich, low simple carbohydrate foods. We reviewed the importance of hydration, regular exercise for stress reduction, and restorative sleep.   Lab Results  Component Value Date   HGBA1C 5.7 (H) 02/19/2021   Lab Results  Component Value Date   INSULIN 13.5 02/19/2021   2. Low serum ferritin level Encouraged to restart PNV.  3. Binge eating disorder, ADHD Not at goal, but quickly improving. Jackee is taking Topamax 25 mg daily and Vyvanse 30 mg daily for BED.  Plan:  Will refill Topamax at current dose and increase Vyvanse to 40 mg daily.  People who binge eat feel as if they don't have control over how much they eat and have feelings of guilt or self-loathing after a binge eating episode. [name] estimates that about 30 percent of adults with binge eating disorder also have a history of ADHD. The FDA has approved Vyvanse as a treatment option for both ADHD and binge eating.  Vyvanse targets the brain's reward center by increasing the levels of dopamine and norepinephrine, the chemicals of the brain responsible for feelings of pleasure. Mindful eating is the recommended nutritional approach to treating BED.   - Refill topiramate (TOPAMAX) 25 MG tablet; Take 1 tablet (25 mg total) by mouth daily.  Dispense: 90 tablet; Refill: 0 - Increase and refill lisdexamfetamine (VYVANSE) 40 MG capsule; Take 1 capsule (40 mg total) by mouth every morning.  Dispense: 30 capsule; Refill: 0  I have consulted the [place] Controlled Substances Registry for this patient, and feel the risk/benefit ratio today is favorable for proceeding with this prescription for a controlled substance. The patient understands monitoring parameters and red flags.   4. At risk for diabetes mellitus During insulin resistance, several metabolic alterations induce the development of cardiovascular disease. For instance, insulin resistance can induce an imbalance in glucose metabolism that generates chronic hyperglycemia, which in turn triggers oxidative stress and causes an inflammatory response that leads to cell damage. Insulin resistance can also alter systemic lipid metabolism which then leads to the development of dyslipidemia and the well-known lipid triad: (1) high levels of plasma triglycerides, (2) low levels of high-density lipoprotein, and (3) the appearance of small dense low-density lipoproteins. This triad, along with endothelial dysfunction, which can also be induced by aberrant insulin signaling, contribute to atherosclerotic plaque formation.   5. Obesity, current BMI 43.7  Course: [name] is currently in the action stage of change. As such, her  goal is to continue with weight loss efforts.   Nutrition goals: She has agreed to practicing portion control and making smarter food choices, such as increasing vegetables and decreasing simple carbohydrates.   Exercise goals: For substantial health  benefits, adults should do at least 150 minutes (2 hours and 30 minutes) a week of moderate-intensity, or 75 minutes (1 hour and 15 minutes) a week of vigorous-intensity aerobic physical activity, or an equivalent combination of moderate- and vigorous-intensity aerobic activity. Aerobic activity should be performed in episodes of at least 10 minutes, and preferably, it should be spread throughout the week.  Behavioral modification strategies: increasing lean protein intake, decreasing simple carbohydrates, increasing vegetables, and increasing water intake.  [name] has agreed to follow-up with our clinic in 4 weeks. She was informed of the importance of frequent follow-up visits to maximize her success with intensive lifestyle modifications for her multiple health conditions.   Objective:   Blood pressure 105/71, pulse 82, temperature 98.3 F (36.8 C), height 5\' 7"  (1.702 m), weight 279 lb (126.6 kg), SpO2 99 %, unknown if currently breastfeeding. Body mass index is 43.7 kg/m.  General: Cooperative, alert, well developed, in no acute distress. HEENT: Conjunctivae and lids unremarkable. Cardiovascular: Regular rhythm.  Lungs: Normal work of breathing. Neurologic: No focal deficits.   Lab Results  Component Value Date   CREATININE 0.71 02/19/2021   BUN 8 02/19/2021   NA 138 02/19/2021   K 4.3 02/19/2021   CL 102 02/19/2021   CO2 22 02/19/2021   Lab Results  Component Value Date   ALT 13 02/19/2021   AST 16 02/19/2021   ALKPHOS 68 02/19/2021   BILITOT 0.3 02/19/2021   Lab Results  Component Value Date   HGBA1C 5.7 (H) 02/19/2021   Lab Results  Component Value Date   INSULIN 13.5 02/19/2021   Lab Results  Component Value Date   TSH 2.790 02/19/2021   Lab Results  Component Value Date   CHOL 150 02/19/2021   HDL 50 02/19/2021   LDLCALC 85 02/19/2021   TRIG 77 02/19/2021   CHOLHDL 3.0 02/19/2021   Lab Results  Component Value Date   VD25OH 30.0 02/19/2021   Lab  Results  Component Value Date   WBC 8.6 02/19/2021   HGB 11.2 02/19/2021   HCT 35.8 02/19/2021   MCV 80 02/19/2021   PLT 441 02/19/2021   Lab Results  Component Value Date   IRON 37 02/19/2021   TIBC 425 02/19/2021   FERRITIN 8 (L) 02/19/2021   Attestation Statements:   Reviewed by clinician on day of visit: allergies, medications, problem list, medical history, surgical history, family history, social history, and previous encounter notes.  I, 04/21/2021, CMA, am acting as transcriptionist for Insurance claims handler, DO  I have reviewed the above documentation for accuracy and completeness, and I agree with the above. Helane Rima, DO

## 2021-07-15 ENCOUNTER — Ambulatory Visit (INDEPENDENT_AMBULATORY_CARE_PROVIDER_SITE_OTHER): Payer: BLUE CROSS/BLUE SHIELD | Admitting: Family Medicine

## 2021-07-15 ENCOUNTER — Other Ambulatory Visit: Payer: Self-pay

## 2021-07-15 ENCOUNTER — Encounter (INDEPENDENT_AMBULATORY_CARE_PROVIDER_SITE_OTHER): Payer: Self-pay | Admitting: Family Medicine

## 2021-07-15 VITALS — BP 119/77 | HR 83 | Temp 97.8°F | Ht 67.0 in | Wt 272.0 lb

## 2021-07-15 DIAGNOSIS — F5081 Binge eating disorder: Secondary | ICD-10-CM | POA: Diagnosis not present

## 2021-07-15 DIAGNOSIS — Z9189 Other specified personal risk factors, not elsewhere classified: Secondary | ICD-10-CM | POA: Diagnosis not present

## 2021-07-15 DIAGNOSIS — F411 Generalized anxiety disorder: Secondary | ICD-10-CM

## 2021-07-15 DIAGNOSIS — Z6841 Body Mass Index (BMI) 40.0 and over, adult: Secondary | ICD-10-CM

## 2021-07-15 DIAGNOSIS — R7303 Prediabetes: Secondary | ICD-10-CM

## 2021-07-16 MED ORDER — FLUOXETINE HCL 20 MG PO TABS: 20.0000 mg | ORAL_TABLET | Freq: Every day | ORAL | 0 refills | Status: DC

## 2021-07-16 MED ORDER — LISDEXAMFETAMINE DIMESYLATE 50 MG PO CAPS: 50.0000 mg | ORAL_CAPSULE | Freq: Every day | ORAL | 0 refills | Status: DC

## 2021-07-16 MED ORDER — METFORMIN HCL 500 MG PO TABS: 500.0000 mg | ORAL_TABLET | Freq: Two times a day (BID) | ORAL | 1 refills | Status: DC

## 2021-07-16 NOTE — Progress Notes (Signed)
Chief Complaint:   OBESITY Michelle Lozano is here to discuss her progress with her obesity treatment plan along with follow-up of her obesity related diagnoses.   Today's visit was #: 5 Starting weight: 307 lbs Starting date: 02/19/2021 Today's weight: 272 lbs Today's date: 07/15/2021 Weight change since last visit: 7 lbs Total lbs lost to date: 35 lbs Body mass index is 42.6 kg/m.  Total weight loss percentage to date: -11.40  Current Meal Plan: practicing portion control and making smarter food choices, such as increasing vegetables and decreasing simple carbohydrates for 0% of the time.  Current Exercise Plan: Increased activity and walking.   Interim History:  Michelle Lozano is doing well.  Denies polyphagia.  She says she got a promotion at work.  She has had a slight decrease in focus.  She is getting protein at each meal.  She is down 35 pounds today.  Assessment/Plan:   1. Prediabetes At goal. Goal is HgbA1c < 5.7.  Medication: metformin 500 mg twice daily.    Plan:  Continue metformin.  She will continue to focus on protein-rich, low simple carbohydrate foods. We reviewed the importance of hydration, regular exercise for stress reduction, and restorative sleep.   Lab Results  Component Value Date   HGBA1C 5.7 (H) 02/19/2021   Lab Results  Component Value Date   INSULIN 13.5 02/19/2021   - Refill metFORMIN (GLUCOPHAGE) 500 MG tablet; Take 1 tablet (500 mg total) by mouth 2 (two) times daily with a meal.  Dispense: 60 tablet; Refill: 1  2. Binge eating disorder, ADHD Improving. Aracelly is taking Vyvanse 40 mg daily for BED.  Plan:  Increase Vyvanse to 50 mg daily, as per below.  - Increase and refill lisdexamfetamine (VYVANSE) 50 MG capsule; Take 1 capsule (50 mg total) by mouth daily.  Dispense: 30 capsule; Refill: 0  I have consulted the [place] Controlled Substances Registry for this patient, and feel the risk/benefit ratio today is favorable for proceeding with this  prescription for a controlled substance. The patient understands monitoring parameters and red flags.   3. GAD (generalized anxiety disorder) Controlled.  Michelle Lozano is taking Prozac 20 mg daily.  Plan:  Continue Prozac 20 mg.  Refill sent to pharmacy today.     - Refill FLUoxetine (PROZAC) 20 MG tablet; Take 1 tablet (20 mg total) by mouth daily.  Dispense: 90 tablet; Refill: 0  4. At risk for deficient intake of food Michelle Lozano was given extensive education and counseling today of more than 9 minutes on risks associated with deficient food intake.  Counseled her on the importance of following our prescribed meal plan and eating adequate amounts of protein.  Discussed with Michelle Lozano that inadequate food intake over longer periods of time can slow their metabolism down significantly.   5. Obesity, current BMI 42.6  Course: Michelle Lozano is currently in the action stage of change. As such, her goal is to continue with weight loss efforts.   Nutrition goals: She has agreed to practicing portion control and making smarter food choices, such as increasing vegetables and decreasing simple carbohydrates.   Exercise goals:  As is.  Behavioral modification strategies: increasing lean protein intake, decreasing simple carbohydrates, increasing vegetables, and increasing water intake.  Michelle Lozano has agreed to follow-up with our clinic in 4 weeks. She was informed of the importance of frequent follow-up visits to maximize her success with intensive lifestyle modifications for her multiple health conditions.   Objective:   Blood pressure 119/77, pulse 83, temperature  97.8 F (36.6 C), temperature source Oral, height 5\' 7"  (1.702 m), weight 272 lb (123.4 kg), SpO2 100 %, unknown if currently breastfeeding. Body mass index is 42.6 kg/m.  General: Cooperative, alert, well developed, in no acute distress. HEENT: Conjunctivae and lids unremarkable. Cardiovascular: Regular rhythm.  Lungs: Normal work of  breathing. Neurologic: No focal deficits.   Lab Results  Component Value Date   CREATININE 0.71 02/19/2021   BUN 8 02/19/2021   NA 138 02/19/2021   K 4.3 02/19/2021   CL 102 02/19/2021   CO2 22 02/19/2021   Lab Results  Component Value Date   ALT 13 02/19/2021   AST 16 02/19/2021   ALKPHOS 68 02/19/2021   BILITOT 0.3 02/19/2021   Lab Results  Component Value Date   HGBA1C 5.7 (H) 02/19/2021   Lab Results  Component Value Date   INSULIN 13.5 02/19/2021   Lab Results  Component Value Date   TSH 2.790 02/19/2021   Lab Results  Component Value Date   CHOL 150 02/19/2021   HDL 50 02/19/2021   LDLCALC 85 02/19/2021   TRIG 77 02/19/2021   CHOLHDL 3.0 02/19/2021   Lab Results  Component Value Date   VD25OH 30.0 02/19/2021   Lab Results  Component Value Date   WBC 8.6 02/19/2021   HGB 11.2 02/19/2021   HCT 35.8 02/19/2021   MCV 80 02/19/2021   PLT 441 02/19/2021   Lab Results  Component Value Date   IRON 37 02/19/2021   TIBC 425 02/19/2021   FERRITIN 8 (L) 02/19/2021   Attestation Statements:   Reviewed by clinician on day of visit: allergies, medications, problem list, medical history, surgical history, family history, social history, and previous encounter notes.  I, 04/21/2021, CMA, am acting as transcriptionist for Insurance claims handler, DO  I have reviewed the above documentation for accuracy and completeness, and I agree with the above. Helane Rima, DO

## 2021-08-27 ENCOUNTER — Encounter (INDEPENDENT_AMBULATORY_CARE_PROVIDER_SITE_OTHER): Payer: Self-pay | Admitting: Family Medicine

## 2021-08-27 ENCOUNTER — Other Ambulatory Visit: Payer: Self-pay

## 2021-08-27 ENCOUNTER — Ambulatory Visit (INDEPENDENT_AMBULATORY_CARE_PROVIDER_SITE_OTHER): Payer: BLUE CROSS/BLUE SHIELD | Admitting: Family Medicine

## 2021-08-27 VITALS — BP 114/72 | HR 80 | Temp 97.9°F | Ht 67.0 in | Wt 271.0 lb

## 2021-08-27 DIAGNOSIS — Z6841 Body Mass Index (BMI) 40.0 and over, adult: Secondary | ICD-10-CM

## 2021-08-27 DIAGNOSIS — F3289 Other specified depressive episodes: Secondary | ICD-10-CM

## 2021-08-27 DIAGNOSIS — R7301 Impaired fasting glucose: Secondary | ICD-10-CM

## 2021-08-27 DIAGNOSIS — F5081 Binge eating disorder: Secondary | ICD-10-CM | POA: Diagnosis not present

## 2021-08-27 MED ORDER — LISDEXAMFETAMINE DIMESYLATE 50 MG PO CAPS: 50.0000 mg | ORAL_CAPSULE | Freq: Every day | ORAL | 0 refills | Status: DC

## 2021-08-27 MED ORDER — TOPIRAMATE 25 MG PO TABS: 25.0000 mg | ORAL_TABLET | Freq: Every day | ORAL | 0 refills | Status: DC

## 2021-08-28 NOTE — Progress Notes (Signed)
Chief Complaint:   OBESITY Michelle Lozano is here to discuss her progress with her obesity treatment plan along with follow-up of her obesity related diagnoses.   Today's visit was #: 6 Starting weight: 307 lbs Starting date: 02/19/2021 Today's weight: 271 lbs Today's date: 08/27/2021 Weight change since last visit: 1 lb Total lbs lost to date: 36 lbs Body mass index is 42.44 kg/m.  Total weight loss percentage to date: -11.73%  Current Meal Plan: practicing portion control and making smarter food choices, such as increasing vegetables and decreasing simple carbohydrates for 0% of the time.  Current Exercise Plan: Cardio for 3 miles 3 times per week.  Interim History:  Michelle Lozano went on an international trip and did not focus on food.  She says she started a running club.  Her clothes are fitting better.  She reports that she is getting her protein in.  She is happy with her progress.  Assessment/Plan:   1. Impaired fasting glucose Continue metformin 500 mg twice daily. The current medical regimen is effective;  continue present plan and medications.  2. Binge eating disorder, ADHD Improving. Michelle Lozano is taking Vyvanse 50 mg daily and topiramate 25 mg daily for BED.  The current medical regimen is effective;  continue present plan and medications.  - Refill lisdexamfetamine (VYVANSE) 50 MG capsule; Take 1 capsule (50 mg total) by mouth daily.  Dispense: 30 capsule; Refill: 0 - lisdexamfetamine (VYVANSE) 50 MG capsule; Take 1 capsule (50 mg total) by mouth daily.  Dispense: 30 capsule; Refill: 0  I have consulted the [place] Controlled Substances Registry for this patient, and feel the risk/benefit ratio today is favorable for proceeding with this prescription for a controlled substance. The patient understands monitoring parameters and red flags.   3. Other depression, with emotional eating Controlled. Medication: Prozac 20 mg daily and topiramate 25 mg daily.  Plan:  Will refill  topiramate today.  Discussed cues and consequences, how thoughts affect eating, model of thoughts, feelings, and behaviors, and strategies for change by focusing on the cue. Discussed cognitive distortions, coping thoughts, and how to change your thoughts.  - Refill topiramate (TOPAMAX) 25 MG tablet; Take 1 tablet (25 mg total) by mouth daily.  Dispense: 90 tablet; Refill: 0  4. Obesity, current BMI 42.6  Course: Michelle Lozano is currently in the action stage of change. As such, her goal is to continue with weight loss efforts.   Nutrition goals: She has agreed to practicing portion control and making smarter food choices, such as increasing vegetables and decreasing simple carbohydrates.   Exercise goals:  As is.  Behavioral modification strategies: increasing lean protein intake, decreasing simple carbohydrates, increasing vegetables, and increasing water intake.  Michelle Lozano has agreed to follow-up with our clinic in 2 months. She was informed of the importance of frequent follow-up visits to maximize her success with intensive lifestyle modifications for her multiple health conditions.   Objective:   Blood pressure 114/72, pulse 80, temperature 97.9 F (36.6 C), temperature source Oral, height 5\' 7"  (1.702 m), weight 271 lb (122.9 kg), SpO2 98 %, unknown if currently breastfeeding. Body mass index is 42.44 kg/m.  General: Cooperative, alert, well developed, in no acute distress. HEENT: Conjunctivae and lids unremarkable. Cardiovascular: Regular rhythm.  Lungs: Normal work of breathing. Neurologic: No focal deficits.   Lab Results  Component Value Date   CREATININE 0.71 02/19/2021   BUN 8 02/19/2021   NA 138 02/19/2021   K 4.3 02/19/2021   CL 102 02/19/2021  CO2 22 02/19/2021   Lab Results  Component Value Date   ALT 13 02/19/2021   AST 16 02/19/2021   ALKPHOS 68 02/19/2021   BILITOT 0.3 02/19/2021   Lab Results  Component Value Date   HGBA1C 5.7 (H) 02/19/2021   Lab  Results  Component Value Date   INSULIN 13.5 02/19/2021   Lab Results  Component Value Date   TSH 2.790 02/19/2021   Lab Results  Component Value Date   CHOL 150 02/19/2021   HDL 50 02/19/2021   LDLCALC 85 02/19/2021   TRIG 77 02/19/2021   CHOLHDL 3.0 02/19/2021   Lab Results  Component Value Date   VD25OH 30.0 02/19/2021   Lab Results  Component Value Date   WBC 8.6 02/19/2021   HGB 11.2 02/19/2021   HCT 35.8 02/19/2021   MCV 80 02/19/2021   PLT 441 02/19/2021   Lab Results  Component Value Date   IRON 37 02/19/2021   TIBC 425 02/19/2021   FERRITIN 8 (L) 02/19/2021   Attestation Statements:   Reviewed by clinician on day of visit: allergies, medications, problem list, medical history, surgical history, family history, social history, and previous encounter notes.  I, Insurance claims handler, CMA, am acting as transcriptionist for Helane Rima, DO  I have reviewed the above documentation for accuracy and completeness, and I agree with the above. -  Helane Rima, DO, MS, FAAFP, DABOM - Family and Bariatric Medicine.

## 2021-09-10 ENCOUNTER — Other Ambulatory Visit: Payer: Self-pay

## 2021-09-10 ENCOUNTER — Other Ambulatory Visit (INDEPENDENT_AMBULATORY_CARE_PROVIDER_SITE_OTHER): Payer: BLUE CROSS/BLUE SHIELD

## 2021-09-10 DIAGNOSIS — Z23 Encounter for immunization: Secondary | ICD-10-CM

## 2021-09-25 ENCOUNTER — Encounter (INDEPENDENT_AMBULATORY_CARE_PROVIDER_SITE_OTHER): Payer: Self-pay | Admitting: Family Medicine

## 2021-09-25 ENCOUNTER — Other Ambulatory Visit (INDEPENDENT_AMBULATORY_CARE_PROVIDER_SITE_OTHER): Payer: Self-pay | Admitting: Family Medicine

## 2021-09-25 DIAGNOSIS — R7303 Prediabetes: Secondary | ICD-10-CM

## 2021-09-25 DIAGNOSIS — F411 Generalized anxiety disorder: Secondary | ICD-10-CM

## 2021-09-25 DIAGNOSIS — F5081 Binge eating disorder: Secondary | ICD-10-CM

## 2021-09-25 MED ORDER — LISDEXAMFETAMINE DIMESYLATE 50 MG PO CAPS: 50.0000 mg | ORAL_CAPSULE | Freq: Every day | ORAL | 0 refills | Status: DC

## 2021-09-25 NOTE — Telephone Encounter (Signed)
Michelle Lozano

## 2021-10-28 ENCOUNTER — Ambulatory Visit (INDEPENDENT_AMBULATORY_CARE_PROVIDER_SITE_OTHER): Payer: BC Managed Care – PPO | Admitting: Family Medicine

## 2021-10-28 ENCOUNTER — Other Ambulatory Visit: Payer: Self-pay

## 2021-10-28 ENCOUNTER — Encounter (INDEPENDENT_AMBULATORY_CARE_PROVIDER_SITE_OTHER): Payer: Self-pay | Admitting: Family Medicine

## 2021-10-28 VITALS — BP 116/76 | HR 81 | Temp 97.6°F | Ht 67.0 in | Wt 267.0 lb

## 2021-10-28 DIAGNOSIS — F3289 Other specified depressive episodes: Secondary | ICD-10-CM

## 2021-10-28 DIAGNOSIS — F5081 Binge eating disorder: Secondary | ICD-10-CM

## 2021-10-28 DIAGNOSIS — Z6841 Body Mass Index (BMI) 40.0 and over, adult: Secondary | ICD-10-CM

## 2021-10-28 DIAGNOSIS — F411 Generalized anxiety disorder: Secondary | ICD-10-CM

## 2021-10-28 DIAGNOSIS — R7303 Prediabetes: Secondary | ICD-10-CM

## 2021-10-28 MED ORDER — METFORMIN HCL 500 MG PO TABS: ORAL_TABLET | ORAL | 0 refills | Status: DC

## 2021-10-28 MED ORDER — LISDEXAMFETAMINE DIMESYLATE 50 MG PO CAPS: 50.0000 mg | ORAL_CAPSULE | Freq: Every day | ORAL | 0 refills | Status: DC

## 2021-10-28 MED ORDER — TOPIRAMATE 25 MG PO TABS: 25.0000 mg | ORAL_TABLET | Freq: Every day | ORAL | 0 refills | Status: DC

## 2021-10-28 MED ORDER — FLUOXETINE HCL 20 MG PO CAPS: 20.0000 mg | ORAL_CAPSULE | Freq: Every day | ORAL | 0 refills | Status: DC

## 2021-10-29 NOTE — Progress Notes (Signed)
Chief Complaint:   OBESITY Michelle Lozano is here to discuss her progress with her obesity treatment plan along with follow-up of her obesity related diagnoses. See Medical Weight Management Flowsheet for complete bioelectrical impedance results.  Today's visit was #: 7 Starting weight: 307 lbs Starting date: 02/19/2021 Weight change since last visit: 4 lbs Total lbs lost to date: 40 lbs Total weight loss percentage to date: -13.03%  Nutrition Plan: Practicing portion control and making smarter food choices, such as increasing vegetables and decreasing simple carbohydrates for 0% of the time. Activity: Cardio - 5k x3.  Interim History: Michelle Lozano says she is enjoying running again.  Assessment/Plan:   1. Prediabetes At goal. Goal is HgbA1c < 5.7.  Medication: metformin 500 mg twice daily.    Plan:  Continue metformin 500 mg twice daily.  She will continue to focus on protein-rich, low simple carbohydrate foods. We reviewed the importance of hydration, regular exercise for stress reduction, and restorative sleep.   Lab Results  Component Value Date   HGBA1C 5.7 (H) 02/19/2021   Lab Results  Component Value Date   INSULIN 13.5 02/19/2021   - Refill metFORMIN (GLUCOPHAGE) 500 MG tablet; TAKE 1 TABLET BY MOUTH TWICE A DAY WITH A MEAL.  Dispense: 60 tablet; Refill: 0  2. Binge eating disorder, ADHD Improving. Qu[place]ia is taking Vyvanse 50 mg daily and topiramate 25 mg daily for BED.  The current medical regimen is effective;  continue present plan and medications.  - Refill lisdexamfetamine (VYVANSE) 50 MG capsule; Take 1 capsule (50 mg total) by mouth daily before breakfast.  Dispense: 30 capsule; Refill: 0 - lisdexamfetamine (VYVANSE) 50 MG capsule; Take 1 capsule (50 mg total) by mouth daily before breakfast.  Dispense: 30 capsule; Refill: 0 - lisdexamfetamine (VYVANSE) 50 MG capsule; Take 1 capsule (50 mg total) by mouth daily before breakfast.  Dispense: 30 capsule; Refill:  0  I have consulted the [place] Controlled Substances Registry for this patient, and feel the risk/benefit ratio today is favorable for proceeding with this prescription for a controlled substance. The patient understands monitoring parameters and red flags.   3. GAD (generalized anxiety disorder) Controlled.  Michelle Lozano is taking Prozac 20 mg daily.   Plan:  Continue Prozac 20 mg.  Refill sent to pharmacy today.   - Refill FLUoxetine (PROZAC) 20 MG capsule; Take 1 capsule (20 mg total) by mouth daily.  Dispense: 90 capsule; Refill: 0  4. Other depression, with emotional eating Controlled. Medication: Prozac 20 mg daily and topiramate 25 mg daily.   Plan:  Will refill topiramate today.  Discussed cues and consequences, how thoughts affect eating, model of thoughts, feelings, and behaviors, and strategies for change by focusing on the cue. Discussed cognitive distortions, coping thoughts, and how to change your thoughts.  - Refill topiramate (TOPAMAX) 25 MG tablet; Take 1 tablet (25 mg total) by mouth daily.  Dispense: 90 tablet; Refill: 0  5. Obesity, current BMI 41.9  Course: Michelle Lozano is currently in the action stage of change. As such, her goal is to continue with weight loss efforts.   Nutrition goals: She has agreed to practicing portion control and making smarter food choices, such as increasing vegetables and decreasing simple carbohydrates.   Exercise goals:  As is.  Behavioral modification strategies: increasing lean protein intake, decreasing simple carbohydrates, increasing vegetables, increasing water intake, and decreasing liquid calories.  Michelle Lozano has agreed to follow-up with our clinic in 4 weeks. She was informed of the importance of  frequent follow-up visits to maximize her success with intensive lifestyle modifications for her multiple health conditions.   Objective:   Blood pressure 116/76, pulse 81, temperature 97.6 F (36.4 C), temperature source Oral, height 5\' 7"   (1.702 m), weight 267 lb (121.1 kg), SpO2 97 %, unknown if currently breastfeeding. Body mass index is 41.82 kg/m.  General: Cooperative, alert, well developed, in no acute distress. HEENT: Conjunctivae and lids unremarkable. Cardiovascular: Regular rhythm.  Lungs: Normal work of breathing. Neurologic: No focal deficits.   Lab Results  Component Value Date   CREATININE 0.71 02/19/2021   BUN 8 02/19/2021   NA 138 02/19/2021   K 4.3 02/19/2021   CL 102 02/19/2021   CO2 22 02/19/2021   Lab Results  Component Value Date   ALT 13 02/19/2021   AST 16 02/19/2021   ALKPHOS 68 02/19/2021   BILITOT 0.3 02/19/2021   Lab Results  Component Value Date   HGBA1C 5.7 (H) 02/19/2021   Lab Results  Component Value Date   INSULIN 13.5 02/19/2021   Lab Results  Component Value Date   TSH 2.790 02/19/2021   Lab Results  Component Value Date   CHOL 150 02/19/2021   HDL 50 02/19/2021   LDLCALC 85 02/19/2021   TRIG 77 02/19/2021   CHOLHDL 3.0 02/19/2021   Lab Results  Component Value Date   VD25OH 30.0 02/19/2021   Lab Results  Component Value Date   WBC 8.6 02/19/2021   HGB 11.2 02/19/2021   HCT 35.8 02/19/2021   MCV 80 02/19/2021   PLT 441 02/19/2021   Lab Results  Component Value Date   IRON 37 02/19/2021   TIBC 425 02/19/2021   FERRITIN 8 (L) 02/19/2021   Attestation Statements:   Reviewed by clinician on day of visit: allergies, medications, problem list, medical history, surgical history, family history, social history, and previous encounter notes.  I, 04/21/2021, CMA, am acting as transcriptionist for Insurance claims handler, DO  I have reviewed the above documentation for accuracy and completeness, and I agree with the above. -  Helane Rima, DO, MS, FAAFP, DABOM - Family and Bariatric Medicine.

## 2021-12-17 ENCOUNTER — Encounter (INDEPENDENT_AMBULATORY_CARE_PROVIDER_SITE_OTHER): Payer: Self-pay | Admitting: Family Medicine

## 2021-12-17 ENCOUNTER — Other Ambulatory Visit (INDEPENDENT_AMBULATORY_CARE_PROVIDER_SITE_OTHER): Payer: Self-pay | Admitting: Family Medicine

## 2021-12-17 DIAGNOSIS — F3289 Other specified depressive episodes: Secondary | ICD-10-CM

## 2021-12-17 DIAGNOSIS — R7303 Prediabetes: Secondary | ICD-10-CM

## 2021-12-18 NOTE — Telephone Encounter (Signed)
Dr.Wallace °

## 2021-12-19 MED ORDER — METFORMIN HCL 500 MG PO TABS: ORAL_TABLET | ORAL | 0 refills | Status: DC

## 2021-12-19 MED ORDER — TOPIRAMATE 25 MG PO TABS: 25.0000 mg | ORAL_TABLET | Freq: Every day | ORAL | 0 refills | Status: DC

## 2021-12-19 NOTE — Telephone Encounter (Signed)
Dr.Wallace °

## 2021-12-30 ENCOUNTER — Ambulatory Visit (INDEPENDENT_AMBULATORY_CARE_PROVIDER_SITE_OTHER): Payer: BC Managed Care – PPO | Admitting: Family Medicine

## 2021-12-30 ENCOUNTER — Encounter (INDEPENDENT_AMBULATORY_CARE_PROVIDER_SITE_OTHER): Payer: Self-pay | Admitting: Family Medicine

## 2021-12-30 ENCOUNTER — Other Ambulatory Visit: Payer: Self-pay

## 2021-12-30 VITALS — BP 116/76 | HR 87 | Temp 97.8°F | Ht 67.0 in | Wt 267.0 lb

## 2021-12-30 DIAGNOSIS — F5081 Binge eating disorder: Secondary | ICD-10-CM | POA: Diagnosis not present

## 2021-12-30 DIAGNOSIS — F411 Generalized anxiety disorder: Secondary | ICD-10-CM

## 2021-12-30 DIAGNOSIS — Z6841 Body Mass Index (BMI) 40.0 and over, adult: Secondary | ICD-10-CM

## 2021-12-30 DIAGNOSIS — R7303 Prediabetes: Secondary | ICD-10-CM

## 2021-12-30 DIAGNOSIS — R790 Abnormal level of blood mineral: Secondary | ICD-10-CM | POA: Diagnosis not present

## 2021-12-30 DIAGNOSIS — E559 Vitamin D deficiency, unspecified: Secondary | ICD-10-CM | POA: Diagnosis not present

## 2021-12-30 DIAGNOSIS — E669 Obesity, unspecified: Secondary | ICD-10-CM

## 2021-12-30 DIAGNOSIS — F3289 Other specified depressive episodes: Secondary | ICD-10-CM

## 2021-12-30 MED ORDER — FLUOXETINE HCL 20 MG PO CAPS: 20.0000 mg | ORAL_CAPSULE | Freq: Every day | ORAL | 0 refills | Status: DC

## 2021-12-30 MED ORDER — TOPIRAMATE 25 MG PO TABS: 25.0000 mg | ORAL_TABLET | Freq: Every day | ORAL | 0 refills | Status: DC

## 2021-12-30 MED ORDER — LISDEXAMFETAMINE DIMESYLATE 50 MG PO CAPS: 50.0000 mg | ORAL_CAPSULE | Freq: Every day | ORAL | 0 refills | Status: DC

## 2021-12-30 MED ORDER — METFORMIN HCL 500 MG PO TABS: ORAL_TABLET | ORAL | 0 refills | Status: DC

## 2021-12-31 LAB — LIPID PANEL
Chol/HDL Ratio: 2.6 ratio (ref 0.0–4.4)
Cholesterol, Total: 154 mg/dL (ref 100–199)
HDL: 59 mg/dL (ref >39)
LDL Chol Calc (NIH): 84 mg/dL (ref 0–99)
Triglycerides: 49 mg/dL (ref 0–149)
VLDL Cholesterol Cal: 11 mg/dL (ref 5–40)

## 2021-12-31 LAB — HEMOGLOBIN A1C
Est. average glucose Bld gHb Est-mCnc: 117 mg/dL
Hgb A1c MFr Bld: 5.7 % — ABNORMAL HIGH (ref 4.8–5.6)

## 2021-12-31 LAB — COMPREHENSIVE METABOLIC PANEL
ALT: 12 IU/L (ref 0–32)
AST: 17 IU/L (ref 0–40)
Albumin/Globulin Ratio: 1.8 (ref 1.2–2.2)
Albumin: 4.4 g/dL (ref 3.8–4.8)
Alkaline Phosphatase: 64 IU/L (ref 44–121)
BUN/Creatinine Ratio: 16 (ref 9–23)
BUN: 11 mg/dL (ref 6–20)
Bilirubin Total: 0.3 mg/dL (ref 0.0–1.2)
CO2: 21 mmol/L (ref 20–29)
Calcium: 9.3 mg/dL (ref 8.7–10.2)
Chloride: 103 mmol/L (ref 96–106)
Creatinine, Ser: 0.7 mg/dL (ref 0.57–1.00)
Globulin, Total: 2.4 g/dL (ref 1.5–4.5)
Glucose: 87 mg/dL (ref 70–99)
Potassium: 4.3 mmol/L (ref 3.5–5.2)
Sodium: 137 mmol/L (ref 134–144)
Total Protein: 6.8 g/dL (ref 6.0–8.5)
eGFR: 116 mL/min/{1.73_m2} (ref >59)

## 2021-12-31 LAB — CBC WITH DIFFERENTIAL/PLATELET
Basophils Absolute: 0.1 10*3/uL (ref 0.0–0.2)
Basos: 1 %
EOS (ABSOLUTE): 0.1 10*3/uL (ref 0.0–0.4)
Eos: 1 %
Hematocrit: 33.8 % — ABNORMAL LOW (ref 34.0–46.6)
Hemoglobin: 10.7 g/dL — ABNORMAL LOW (ref 11.1–15.9)
Immature Grans (Abs): 0 10*3/uL (ref 0.0–0.1)
Immature Granulocytes: 0 %
Lymphocytes Absolute: 2.4 10*3/uL (ref 0.7–3.1)
Lymphs: 33 %
MCH: 25.8 pg — ABNORMAL LOW (ref 26.6–33.0)
MCHC: 31.7 g/dL (ref 31.5–35.7)
MCV: 81 fL (ref 79–97)
Monocytes Absolute: 0.4 10*3/uL (ref 0.1–0.9)
Monocytes: 6 %
Neutrophils Absolute: 4.3 10*3/uL (ref 1.4–7.0)
Neutrophils: 59 %
Platelets: 513 10*3/uL — ABNORMAL HIGH (ref 150–450)
RBC: 4.15 x10E6/uL (ref 3.77–5.28)
RDW: 14.6 % (ref 11.7–15.4)
WBC: 7.2 10*3/uL (ref 3.4–10.8)

## 2021-12-31 LAB — IRON,TIBC AND FERRITIN PANEL
Ferritin: 6 ng/mL — ABNORMAL LOW (ref 15–150)
Iron Saturation: 6 % — CL (ref 15–55)
Iron: 22 ug/dL — ABNORMAL LOW (ref 27–159)
Total Iron Binding Capacity: 398 ug/dL (ref 250–450)
UIBC: 376 ug/dL (ref 131–425)

## 2021-12-31 LAB — VITAMIN D 25 HYDROXY (VIT D DEFICIENCY, FRACTURES): Vit D, 25-Hydroxy: 36.5 ng/mL (ref 30.0–100.0)

## 2021-12-31 NOTE — Progress Notes (Signed)
Chief Complaint:   OBESITY Michelle Lozano is here to discuss her progress with her obesity treatment plan along with follow-up of her obesity related diagnoses. See Medical Weight Management Flowsheet for complete bioelectrical impedance results.  Today's visit was #: 8 Starting weight: 307 lbs Starting date: 02/19/2021 Weight change since last visit: 0 Total lbs lost to date: 40 lbs Total weight loss percentage to date: -13.03%  Nutrition Plan: Practicing portion control and making smarter food choices, such as increasing vegetables and decreasing simple carbohydrates for 0% of the time. Activity: Cardio for 3 miles 1 time per week.  Interim History: Michelle Lozano says that her husband was laid off, so she has been working to save money.  She has been out of her medications for 2 weeks.  Assessment/Plan:   1. Prediabetes Not optimized. Goal is HgbA1c < 5.7.  Medication: metformin 500 mg twice daily.    Plan: Continue metformin 500 mg twice daily.  Will refill today. She will continue to focus on protein-rich, low simple carbohydrate foods. We reviewed the importance of hydration, regular exercise for stress reduction, and restorative sleep. Check labs today.  Lab Results  Component Value Date   HGBA1C 5.7 (H) 02/19/2021   Lab Results  Component Value Date   INSULIN 13.5 02/19/2021   - Refill metFORMIN (GLUCOPHAGE) 500 MG tablet; TAKE 1 TABLET BY MOUTH TWICE A DAY WITH A MEAL.  Dispense: 60 tablet; Refill: 0 - Comprehensive metabolic panel - Lipid panel - Hemoglobin A1c  2. Vitamin D deficiency Not at goal.  She is not taking a vitamin D supplement at this time.  Plan: Will check vitamin D level today.  Lab Results  Component Value Date   VD25OH 30.0 02/19/2021   - VITAMIN D 25 Hydroxy (Vit-D Deficiency, Fractures)  3. Low serum ferritin level Michelle Lozano is not taking an iron supplement.  Plan:  Check labs today.  - CBC with Differential/Platelet - Iron, TIBC and  Ferritin Panel  4. Binge eating disorder, ADHD Michelle Lozano was taking Vyvanse 50 mg daily.  She has been out of her medication for 2 weeks.  Plan:  Vyvanse 50 mg daily.  Will refill today.  The goals for treatment of BED are to reduce eating binges and to achieve healthy eating habits. Because binge eating can correlate with negative emotions, treatment may also address any other mental-health issues, such as depression.  People who binge eat feel as if they don't have control over how much they eat and have feelings of guilt or self-loathing after a binge eating episode.  The FDA has approved Vyvanse as a treatment option for binge eating disorder. Vyvanse targets the brain's reward center by increasing the levels of dopamine and norepinephrine, the chemicals of the brain responsible for feelings of pleasure.  Mindful eating is the recommended nutritional approach to treating BED.   - Refill lisdexamfetamine (VYVANSE) 50 MG capsule; Take 1 capsule (50 mg total) by mouth daily before breakfast.  Dispense: 30 capsule; Refill: 0  I have consulted the [place] Controlled Substances Registry for this patient, and feel the risk/benefit ratio today is favorable for proceeding with this prescription for a controlled substance. The patient understands monitoring parameters and red flags.   5. GAD (generalized anxiety disorder) Controlled.  Michelle Lozano is taking Prozac 20 mg daily.   Plan:  Continue Prozac 20 mg.  Refill sent to pharmacy today.   - Refill FLUoxetine (PROZAC) 20 MG capsule; Take 1 capsule (20 mg total) by mouth daily.  Dispense: 90 capsule; Refill: 0  6. Other depression, with emotional eating Controlled. Medication: Prozac 20 mg daily and topiramate 25 mg daily.   Plan:  Will refill topiramate today.  Discussed cues and consequences, how thoughts affect eating, model of thoughts, feelings, and behaviors, and strategies for change by focusing on the cue. Discussed cognitive distortions, coping  thoughts, and how to change your thoughts.  - Refill topiramate (TOPAMAX) 25 MG tablet; Take 1 tablet (25 mg total) by mouth daily.  Dispense: 90 tablet; Refill: 0  7. Obesity, current BMI 41.8  Course: Michelle Lozano is currently in the action stage of change. As such, her goal is to continue with weight loss efforts.   Nutrition goals: She has agreed to practicing portion control and making smarter food choices, such as increasing vegetables and decreasing simple carbohydrates.   Exercise goals:  As is.  Behavioral modification strategies: increasing lean protein intake, decreasing simple carbohydrates, increasing vegetables, and increasing water intake.  Michelle Lozano has agreed to follow-up with our clinic in 8 weeks. She was informed of the importance of frequent follow-up visits to maximize her success with intensive lifestyle modifications for her multiple health conditions.   Michelle Lozano was informed we would discuss her lab results at her next visit unless there is a critical issue that needs to be addressed sooner. Michelle Lozano agreed to keep her next visit at the agreed upon time to discuss these results.  Objective:   Blood pressure 116/76, pulse 87, temperature 97.8 F (36.6 C), temperature source Oral, height 5\' 7"  (1.702 m), weight 267 lb (121.1 kg), SpO2 99 %, unknown if currently breastfeeding. Body mass index is 41.82 kg/m.  General: Cooperative, alert, well developed, in no acute distress. HEENT: Conjunctivae and lids unremarkable. Cardiovascular: Regular rhythm.  Lungs: Normal work of breathing. Neurologic: No focal deficits.   Lab Results  Component Value Date   CREATININE 0.71 02/19/2021   BUN 8 02/19/2021   NA 138 02/19/2021   K 4.3 02/19/2021   CL 102 02/19/2021   CO2 22 02/19/2021   Lab Results  Component Value Date   ALT 13 02/19/2021   AST 16 02/19/2021   ALKPHOS 68 02/19/2021   BILITOT 0.3 02/19/2021   Lab Results  Component Value Date   HGBA1C 5.7 (H)  02/19/2021   Lab Results  Component Value Date   INSULIN 13.5 02/19/2021   Lab Results  Component Value Date   TSH 2.790 02/19/2021   Lab Results  Component Value Date   CHOL 150 02/19/2021   HDL 50 02/19/2021   LDLCALC 85 02/19/2021   TRIG 77 02/19/2021   CHOLHDL 3.0 02/19/2021   Lab Results  Component Value Date   VD25OH 30.0 02/19/2021   Lab Results  Component Value Date   WBC 8.6 02/19/2021   HGB 11.2 02/19/2021   HCT 35.8 02/19/2021   MCV 80 02/19/2021   PLT 441 02/19/2021   Lab Results  Component Value Date   IRON 37 02/19/2021   TIBC 425 02/19/2021   FERRITIN 8 (L) 02/19/2021   Attestation Statements:   Reviewed by clinician on day of visit: allergies, medications, problem list, medical history, surgical history, family history, social history, and previous encounter notes.  I, 04/21/2021, CMA, am acting as transcriptionist for Insurance claims handler, DO  I have reviewed the above documentation for accuracy and completeness, and I agree with the above. -  Helane Rima, DO, MS, FAAFP, DABOM - Family and Bariatric Medicine.

## 2022-02-24 ENCOUNTER — Encounter (INDEPENDENT_AMBULATORY_CARE_PROVIDER_SITE_OTHER): Payer: Self-pay | Admitting: Family Medicine

## 2022-02-24 ENCOUNTER — Other Ambulatory Visit (INDEPENDENT_AMBULATORY_CARE_PROVIDER_SITE_OTHER): Payer: Self-pay | Admitting: Family Medicine

## 2022-02-24 DIAGNOSIS — F411 Generalized anxiety disorder: Secondary | ICD-10-CM

## 2022-02-24 DIAGNOSIS — F5081 Binge eating disorder: Secondary | ICD-10-CM

## 2022-02-24 MED ORDER — LISDEXAMFETAMINE DIMESYLATE 50 MG PO CAPS: 50.0000 mg | ORAL_CAPSULE | Freq: Every day | ORAL | 0 refills | Status: DC

## 2022-02-27 ENCOUNTER — Ambulatory Visit (INDEPENDENT_AMBULATORY_CARE_PROVIDER_SITE_OTHER): Payer: BC Managed Care – PPO | Admitting: Family Medicine

## 2022-04-23 ENCOUNTER — Encounter: Payer: Self-pay | Admitting: Family Medicine

## 2022-04-23 NOTE — Telephone Encounter (Signed)
Nurses-please see phone message  Definitely needs to be seen Please reach out to them to schedule up in a visit this week with myself  We can certainly do our best to charge a lower ED plus also uninsured gets a significant discount Overall much cheaper than having to go to a urgent care

## 2022-05-22 DIAGNOSIS — Z01419 Encounter for gynecological examination (general) (routine) without abnormal findings: Secondary | ICD-10-CM | POA: Diagnosis not present

## 2022-05-22 DIAGNOSIS — Z6841 Body Mass Index (BMI) 40.0 and over, adult: Secondary | ICD-10-CM | POA: Diagnosis not present

## 2022-06-10 DIAGNOSIS — L237 Allergic contact dermatitis due to plants, except food: Secondary | ICD-10-CM | POA: Diagnosis not present

## 2022-06-12 ENCOUNTER — Institutional Professional Consult (permissible substitution): Payer: Self-pay | Admitting: Plastic Surgery

## 2022-06-25 ENCOUNTER — Encounter (INDEPENDENT_AMBULATORY_CARE_PROVIDER_SITE_OTHER): Payer: Self-pay

## 2022-09-05 ENCOUNTER — Encounter: Payer: Self-pay | Admitting: Family Medicine

## 2022-09-05 NOTE — Telephone Encounter (Signed)
Patient scheduled appointment to discuss

## 2022-09-22 ENCOUNTER — Encounter: Payer: Self-pay | Admitting: Family Medicine

## 2022-09-22 ENCOUNTER — Ambulatory Visit: Payer: BC Managed Care – PPO | Admitting: Family Medicine

## 2022-09-22 VITALS — BP 124/70 | Wt 303.0 lb

## 2022-09-22 DIAGNOSIS — F5081 Binge eating disorder: Secondary | ICD-10-CM | POA: Diagnosis not present

## 2022-09-22 DIAGNOSIS — R0683 Snoring: Secondary | ICD-10-CM | POA: Diagnosis not present

## 2022-09-22 DIAGNOSIS — R7303 Prediabetes: Secondary | ICD-10-CM

## 2022-09-22 DIAGNOSIS — E611 Iron deficiency: Secondary | ICD-10-CM

## 2022-09-22 DIAGNOSIS — F411 Generalized anxiety disorder: Secondary | ICD-10-CM | POA: Diagnosis not present

## 2022-09-22 MED ORDER — FLUOXETINE HCL 20 MG PO CAPS: 20.0000 mg | ORAL_CAPSULE | Freq: Every day | ORAL | 1 refills | Status: AC

## 2022-09-22 MED ORDER — LISDEXAMFETAMINE DIMESYLATE 50 MG PO CAPS: 50.0000 mg | ORAL_CAPSULE | Freq: Every day | ORAL | 0 refills | Status: DC

## 2022-09-22 MED ORDER — METFORMIN HCL 500 MG PO TABS: ORAL_TABLET | ORAL | 5 refills | Status: DC

## 2022-09-22 NOTE — Progress Notes (Signed)
   Subjective:    Patient ID: Michelle Lozano, female    DOB: 09-25-1986, 36 y.o.   MRN: [id number]  HPI Pt arrives to obtain referral for sleep study. Pt would like to have this done at hospital due to toddlers at home. Pt states she has been snoring and feeling tired through out the day.    Review of Systems     Objective:   Physical Exam General-in no acute distress Eyes-no discharge Lungs-respiratory rate normal, CTA CV-no murmurs,RRR Extremities skin warm dry no edema Neuro grossly normal Behavior normal, alert        Assessment & Plan:  1. GAD (generalized anxiety disorder) Prozac reinitiate patient was going to healthy weight wellness but that provider moved away - FLUoxetine (PROZAC) 20 MG capsule; Take 1 capsule (20 mg total) by mouth daily.  Dispense: 90 capsule; Refill: 1  2. Prediabetes Metformin minimize starches in diet regular physical activity - metFORMIN (GLUCOPHAGE) 500 MG tablet; TAKE 1 TABLET BY MOUTH TWICE A DAY WITH A MEAL.  Dispense: 60 tablet; Refill: 5  3. Binge eating disorder, ADHD Patient has been using in the afternoon she states the medicine dramatically helped her therefore we will go ahead with prescription - lisdexamfetamine (VYVANSE) 50 MG capsule; Take 1 capsule (50 mg total) by mouth daily before breakfast.  Dispense: 30 capsule; Refill: 0  4. Snoring Referral for sleep apnea  5. Iron deficiency Check lab work before next visit  6. Morbid obesity ([place]) Check lab work before next visit Portion control regular physical activity Vyvanse Patient to give [place] feedback within 3 weeks how this is doing if it is doing well we will send in additional prescriptions

## 2022-09-22 NOTE — Patient Instructions (Signed)
Michelle Hives DO plastic surgery   Discussion of GLP-1 medications Please remember these medications are to assist with weight reduction and diabetes control.  They do not take the place of healthy eating and regular physical activity. There are several GLP-1 medications that can help with weight reduction and diabetes control.  GLP-1 medications with indication for diabetes-Trulicity, Victoza, Bydureon , Mounjaro, Ozempic, and Rybelsus (although these are injected medicines except for Rybelsus which is a pill) GLP-1 medications with indications for weight loss-Wegovy, and Saxenda  Mechanism of action  These medications stimulate glucagon peptide receptors.  By doing so it does the following:  1-slows down stomach emptying-essentially food takes longer to go through the stomach and the intestines-this can lessen appetite  2-reduces glucagon secretion from the pancreas-this helps keep blood glucose levels stable between meals  3-increase his insulin release from the pancreas-with diabetics this helps keep blood glucose levels stable  4-promotes the feeling of being full in the brain-encouraged him receptors in the brain received the signal so the brain and body know that it is time to stop eating Benefits of the medication  1-reduced weight-reduction of weight is more significant at higher dosing.  Not as much weight reduction with Rybelsus   A- typically Wegovy at higher doses can assist with significant weight reduction.  2-improved blood glucose levels-with diabetics typically we see a significant drop with A1c when the medication is adjusted appropriately.  3-decrease risk of heart attacks and strokes-individuals with type 2 diabetes are at increased risk of heart attacks and strokes.  These GLP-1 medications can reduce the risk by 22%.  Risk of GLP-1 medications-these are some of the risks.  It is important to talk with your provider in a shared discussion before starting any  medication.  Contraindications to GLP-1 medications-in other words who should not take these.  1-individuals with history of thyroid medullary cancer  2-individuals with family history of multiple endocrine neoplasm syndrome type II (MEN 2)  3-individuals with a history of pancreatitis  4-those with a history of severe hypersensitivity or allergy reactions to GLP-1 medications  5-should be avoided in individuals with a history of suicidal attempts or active suicidal ideation.  Cautions include- 1- risk of thyroid C-cell tumors were seen in rats during clinical testing in humans the relevancy of this information has not been determined 2-risk of pancreatitis including fatal and nonfatal hemorrhagic or necrotizing pancreatitis 3-gallbladder disease slightly increased risk of gallstones 4-hypoglycemia-more common with individuals who are on insulin or sulfonylurea such as glipizide 5-acute kidney injury or worsening of chronic renal failure often this is triggered by severe vomiting and diarrhea as side effects to GLP-1. 6-slightly increased risk of diabetic retinopathy complications in patients with type 2 diabetes 7-heart rate increase-slight increase of base heart rate with GLP-1 8-suicidal behavior and ideation has been reported in clinical trials with other weight loss medicines.  If depression occurs with medication or suicidal ideation stop medication immediately notify provider or seek help.  Common side effects include nausea, vomiting, diarrhea, constipation and increased heart rate.  Less common side effects severe abdominal pain-if this occurs notify provider stop medication get tested for pancreatitis. Full discussion with your provider should occur before starting these medicines.

## 2022-09-23 ENCOUNTER — Encounter: Payer: Self-pay | Admitting: Family Medicine

## 2022-09-23 ENCOUNTER — Other Ambulatory Visit: Payer: Self-pay | Admitting: Family Medicine

## 2022-09-23 DIAGNOSIS — F5081 Binge eating disorder: Secondary | ICD-10-CM

## 2022-09-23 MED ORDER — LISDEXAMFETAMINE DIMESYLATE 50 MG PO CAPS: 50.0000 mg | ORAL_CAPSULE | Freq: Every day | ORAL | 0 refills | Status: DC

## 2022-09-23 NOTE — Progress Notes (Signed)
Referral ordered in EPIC. 

## 2022-09-23 NOTE — Addendum Note (Signed)
Addended by: Dairl Ponder on: 09/23/2022 09:27 AM   Modules accepted: Orders

## 2022-10-13 ENCOUNTER — Encounter: Payer: Self-pay | Admitting: Family Medicine

## 2022-11-24 ENCOUNTER — Institutional Professional Consult (permissible substitution): Payer: BC Managed Care – PPO | Admitting: Neurology

## 2022-12-23 ENCOUNTER — Ambulatory Visit: Payer: BC Managed Care – PPO | Admitting: Family Medicine

## 2022-12-30 ENCOUNTER — Ambulatory Visit (INDEPENDENT_AMBULATORY_CARE_PROVIDER_SITE_OTHER): Payer: BC Managed Care – PPO | Admitting: Neurology

## 2022-12-30 ENCOUNTER — Encounter: Payer: Self-pay | Admitting: Neurology

## 2022-12-30 VITALS — BP 128/84 | HR 71 | Ht 67.0 in | Wt 305.8 lb

## 2022-12-30 DIAGNOSIS — G4719 Other hypersomnia: Secondary | ICD-10-CM | POA: Diagnosis not present

## 2022-12-30 DIAGNOSIS — R0683 Snoring: Secondary | ICD-10-CM

## 2022-12-30 DIAGNOSIS — R0681 Apnea, not elsewhere classified: Secondary | ICD-10-CM

## 2022-12-30 DIAGNOSIS — Z820 Family history of epilepsy and other diseases of the nervous system: Secondary | ICD-10-CM

## 2022-12-30 DIAGNOSIS — R351 Nocturia: Secondary | ICD-10-CM

## 2022-12-30 DIAGNOSIS — G4730 Sleep apnea, unspecified: Secondary | ICD-10-CM | POA: Diagnosis not present

## 2022-12-30 NOTE — Patient Instructions (Signed)

## 2022-12-30 NOTE — Progress Notes (Signed)
Subjective:    Patient ID: Michelle Lozano is a 37 y.o. female.  HPI    Star Age, MD, PhD [facility] [address], [address] [place], [place] [address]  Dear Dr. Wolfgang Phoenix,  I saw your patient, Michelle Lozano, upon your kind request in my sleep clinic today for initial consultation of her sleep disorder, in particular, concern for underlying obstructive sleep apnea.  The patient is unaccompanied today.  As you know, Michelle Lozano is a 37 year old female with an underlying medical history of prediabetes, ADHD, anxiety, iron deficiency, and severe obesity with a BMI of over 45, who reports snoring and excessive daytime somnolence.  Her Epworth sleepiness score is 6 out of 24, fatigue severity score is 22 out of 63.  She is married and lives with her husband and 2 young boys, ages 3 and 62.  Her 31-year-old sometimes ends up sleeping in their bed.  She has 2 dogs in the household, one of them sleeps under the bed and the other on a chair next to her bed.  She has had weight fluctuation, she was able to go down to 260 pounds at 1 point but it have weight gain.  Her highest weight was 315 pounds.  I reviewed your office note from 09/22/2022.  Bedtime is generally around 9 PM and rise time around 6:30 AM.  She denies recurrent morning headaches.  She has nocturia about once per average night.  She works in [employer] fund[employer] and has to travel some for her job may be 4 times a year.  She has been on Vyvanse as needed.  She does have a TV on in her bedroom, puts it on a white noise channel.  Her husband has sleep apnea and is on a CPAP machine.  She also reports a family history of sleep apnea in her father.  She drinks no daily caffeine, occasional soda or coffee.  She drinks alcohol occasionally.  She is a non-smoker.  Her Past Medical History Is Significant For: Past Medical History:  Diagnosis Date   Anxiety    Pre-diabetes     Her Past Surgical History Is Significant  For: Past Surgical History:  Procedure Laterality Date   CESAREAN SECTION N/A 07/24/2017   Procedure: CESAREAN SECTION;  Surgeon: Brien Few, MD;  Location: [facility];  Service: Obstetrics;  Laterality: N/A;   CESAREAN SECTION N/A 06/10/2019   Procedure: CESAREAN SECTION;  Surgeon: Jerelyn Charles, MD;  Location: [facility];  Service: Obstetrics;  Laterality: N/A;   LEEP     TONSILLECTOMY      Her Family History Is Significant For: Family History  Problem Relation Age of Onset   Thyroid disease Father    Thyroid disease Sister    Diabetes Maternal Grandfather     Her Social History Is Significant For: Social History   Socioeconomic History   Marital status: Married    Spouse name: Not on file   Number of children: Not on file   Years of education: Not on file   Highest education level: Not on file  Occupational History   Occupation: Sales  Tobacco Use   Smoking status: Former    Types: Cigarettes    Quit date: 07/25/2007    Years since quitting: 15.4   Smokeless tobacco: Never  Vaping Use   Vaping Use: Never used  Substance and Sexual Activity   Alcohol use: No   Drug use: No   Sexual activity: Yes  Birth control/protection: None  Other Topics Concern   Not on file  Social History Narrative   Caffiene- occasional prn 1 cup (early afternoon).     2 kids, (69yr5 yr)   Work [employer] photographer    Social Determinants of Health   Financial Resource Strain: Low Risk  (06/02/2019)   Overall Financial Resource Strain (CARDIA)    Difficulty of Paying Living Expenses: Not hard at all  Food Insecurity: No Food Insecurity (06/02/2019)   Hunger Vital Sign    Worried About Running Out of Food in the Last Year: Never true    Ran Out of Food in the Last Year: Never true  Transportation Needs: Unknown (06/02/2019)   PRAPARE - THydrologist(Medical): No    Lack of Transportation (Non-Medical): Not on file  Physical Activity: Not on file  Stress:  Stress Concern Present (06/02/2019)   FBadger Lee   Feeling of Stress : To some extent  Social Connections: Not on file    Her Allergies Are:  No Known Allergies:   Her Current Medications Are:  Outpatient Encounter Medications as of 12/30/2022  Medication Sig   FLUoxetine (PROZAC) 20 MG capsule Take 1 capsule (20 mg total) by mouth daily.   levonorgestrel (MIRENA, 52 MG,) 20 MCG/24HR IUD Mirena 20 mcg/24 hours (7 yrs) 52 mg intrauterine device  Take 1 device by intrauterine route.   lisdexamfetamine (VYVANSE) 50 MG capsule Take 1 capsule (50 mg total) by mouth daily before breakfast.   metFORMIN (GLUCOPHAGE) 500 MG tablet TAKE 1 TABLET BY MOUTH TWICE A DAY WITH A MEAL.   topiramate (TOPAMAX) 25 MG tablet Take 1 tablet (25 mg total) by mouth daily.   No facility-administered encounter medications on file as of 12/30/2022.  :   Review of Systems:  Out of a complete 14 point review of systems, all are reviewed and negative with the exception of these symptoms as listed below:   Review of Systems  Neurological:        Prediabetes, snoring, obesity, fatigue. ESS 6, FSS 22.    Objective:  Neurological Exam  Physical Exam Physical Examination:   Vitals:   12/30/22 1250  BP: 128/84  Pulse: 71    General Examination: The patient is a very pleasant 37y.o. female in no acute distress. She appears well-developed and well-nourished and well groomed.   HEENT: Normocephalic, atraumatic, pupils are equal, round and reactive to light, extraocular tracking is good without limitation to gaze excursion or nystagmus noted. Hearing is grossly intact. Face is symmetric with normal facial animation. Speech is clear with no dysarthria noted. There is no hypophonia. There is no lip, neck/head, jaw or voice tremor. Neck is supple with full range of passive and active motion. There are no carotid bruits on auscultation. Oropharynx  exam reveals: mild mouth dryness, adequate dental hygiene and mild airway crowding secondary to small airway.  Mallampati is class I, tonsils absent and uvula on the smaller side.  Neck circumference 16-1/8 inches.  She has a mild overbite.  Nasal inspection reveals very slight deviated septum to the left.  Tongue protrudes centrally and palate elevates symmetrically.    Chest: Clear to auscultation without wheezing, rhonchi or crackles noted.  Heart: S1+S2+0, regular and normal without murmurs, rubs or gallops noted.   Abdomen: Soft, non-tender and non-distended.  Extremities: There is no pitting edema in the distal lower extremities bilaterally.   Skin: Warm  and dry without trophic changes noted.   Musculoskeletal: exam reveals no obvious joint deformities.   Neurologically:  Mental status: The patient is awake, alert and oriented in all 4 spheres. Her immediate and remote memory, attention, language skills and fund of knowledge are appropriate. There is no evidence of aphasia, agnosia, apraxia or anomia. Speech is clear with normal prosody and enunciation. Thought process is linear. Mood is normal and affect is normal.  Cranial nerves II - XII are as described above under HEENT exam.  Motor exam: Normal bulk, strength and tone is noted. There is no obvious action or resting tremor.  Fine motor skills and coordination: grossly intact.  Cerebellar testing: No dysmetria or intention tremor. There is no truncal or gait ataxia.  Sensory exam: intact to light touch in the upper and lower extremities.  Gait, station and balance: She stands easily. No veering to one side is noted. No leaning to one side is noted. Posture is age-appropriate and stance is narrow based. Gait shows normal stride length and normal pace. No problems turning are noted.   Assessment and Plan:  In summary, Michelle Lozano is a very pleasant 37 y.o.-year old female with an underlying medical history of prediabetes, ADHD,  anxiety, iron deficiency, and severe obesity with a BMI of over 12, whose history and physical exam are concerning for sleep disordered breathing, supporting a current working diagnosis of unspecified sleep apnea, with the main differential diagnoses of obstructive sleep apnea (OSA) versus upper airway resistance syndrome (UARS) versus central sleep apnea (CSA), or mixed sleep apnea. A laboratory attended sleep study is typically considered "gold standard" for evaluation of sleep disordered breathing.   I had a long chat with the patient about my findings and the diagnosis of sleep apnea, particularly OSA, its prognosis and treatment options. We talked about medical/conservative treatments, surgical interventions and non-pharmacological approaches for symptom control. I explained, in particular, the risks and ramifications of untreated moderate to severe OSA, especially with respect to developing cardiovascular disease down the road, including congestive heart failure (CHF), difficult to treat hypertension, cardiac arrhythmias (particularly A-fib), neurovascular complications including TIA, stroke and dementia. Even type 2 diabetes has, in part, been linked to untreated OSA. Symptoms of untreated OSA may include (but may not be limited to) daytime sleepiness, nocturia (i.e. frequent nighttime urination), memory problems, mood irritability and suboptimally controlled or worsening mood disorder such as depression and/or anxiety, lack of energy, lack of motivation, physical discomfort, as well as recurrent headaches, especially morning or nocturnal headaches. We talked about the importance of maintaining a healthy lifestyle and striving for healthy weight. In addition, we talked about the importance of striving for and maintaining good sleep hygiene. I recommended a sleep study at this time. I outlined the differences between a laboratory attended sleep study which is considered more comprehensive and accurate over  the option of a home sleep test (HST); the latter may lead to underestimation of sleep disordered breathing in some instances and does not help with diagnosing upper airway resistance syndrome and is not accurate enough to diagnose primary central sleep apnea typically. I outlined possible surgical and non-surgical treatment options of OSA, including the use of a positive airway pressure (PAP) device (i.e. CPAP, AutoPAP/APAP or BiPAP in certain circumstances), a custom-made dental device (aka oral appliance, which would require a referral to a specialist dentist or orthodontist typically, and is generally speaking not considered for patients with full dentures or edentulous state), upper airway surgical options, such as  traditional UPPP (which is not considered a first-line treatment) or the Inspire device (hypoglossal nerve stimulator, which would involve a referral for consultation with an ENT surgeon, after careful selection, following inclusion criteria - also not first-line treatment). I explained the PAP treatment option to the patient in detail, as this is generally considered first-line treatment.  The patient indicated that she would be willing to try PAP therapy, if the need arises. I explained the importance of being compliant with PAP treatment, not only for insurance purposes but primarily to improve patient's symptoms symptoms, and for the patient's long term health benefit, including to reduce Her cardiovascular risks longer-term.    We will pick up our discussion about the next steps and treatment options after testing.  We will keep her posted as to the test results by phone call and/or MyChart messaging where possible.  We will plan to follow-up in sleep clinic accordingly as well.  I answered all her questions today and the patient was in agreement.   I encouraged her to call with any interim questions, concerns, problems or updates or email [place] through [place].  Generally speaking, sleep test  authorizations may take up to 2 weeks, sometimes less, sometimes longer, the patient is encouraged to get in touch with [place] if they do not hear back from the sleep lab staff directly within the next 2 weeks.  Thank you very much for allowing me to participate in the care of this nice patient. If I can be of any further assistance to you please do not hesitate to call me at [phone].  Sincerely,   Star Age, MD, PhD

## 2023-01-21 ENCOUNTER — Ambulatory Visit: Payer: BC Managed Care – PPO | Admitting: Neurology

## 2023-01-21 DIAGNOSIS — G4733 Obstructive sleep apnea (adult) (pediatric): Secondary | ICD-10-CM | POA: Diagnosis not present

## 2023-01-21 DIAGNOSIS — G4730 Sleep apnea, unspecified: Secondary | ICD-10-CM

## 2023-01-21 DIAGNOSIS — G4719 Other hypersomnia: Secondary | ICD-10-CM

## 2023-01-21 DIAGNOSIS — R351 Nocturia: Secondary | ICD-10-CM

## 2023-01-21 DIAGNOSIS — R0681 Apnea, not elsewhere classified: Secondary | ICD-10-CM

## 2023-01-21 DIAGNOSIS — R0683 Snoring: Secondary | ICD-10-CM

## 2023-01-21 DIAGNOSIS — Z820 Family history of epilepsy and other diseases of the nervous system: Secondary | ICD-10-CM

## 2023-01-22 ENCOUNTER — Other Ambulatory Visit: Payer: Self-pay | Admitting: Family Medicine

## 2023-01-22 DIAGNOSIS — F5081 Binge eating disorder: Secondary | ICD-10-CM

## 2023-01-27 ENCOUNTER — Telehealth: Payer: Self-pay | Admitting: Family Medicine

## 2023-01-27 MED ORDER — LISDEXAMFETAMINE DIMESYLATE 50 MG PO CAPS: 50.0000 mg | ORAL_CAPSULE | Freq: Every day | ORAL | 0 refills | Status: DC

## 2023-01-27 NOTE — Telephone Encounter (Signed)
1 month refill was sent in on her ADD medicine that also helps with binge eating but patient does need to do follow-up office visit she needs to go ahead and schedule no additional prescriptions will be sent in other than the 1 I sent in today thank you-after her office visit we can send an additional

## 2023-01-27 NOTE — Telephone Encounter (Signed)
Patient notified and verbalized understanding and will call back to schedule an appointment

## 2023-01-29 NOTE — Progress Notes (Signed)
See procedure note.

## 2023-02-02 DIAGNOSIS — N62 Hypertrophy of breast: Secondary | ICD-10-CM | POA: Diagnosis not present

## 2023-02-02 NOTE — Procedures (Signed)
   [facility]  HOME SLEEP TEST (Watch PAT) REPORT  STUDY DATE: 01/27/2023  DOB: 02-18-1986  MRN: [id number]  ORDERING CLINICIAN: Star Age, MD, PhD   REFERRING CLINICIAN: Kathyrn Drown, MD   CLINICAL INFORMATION/HISTORY: 37 year old female with an underlying medical history of prediabetes, ADHD, anxiety, iron deficiency, and severe obesity with a BMI of over 45, who reports snoring and excessive daytime somnolence.   Epworth sleepiness score: 6/24.  BMI: 47.8 kg/m  FINDINGS:   Sleep Summary:   Total Recording Time (hours, min): 9 hours, 49 min  Total Sleep Time (hours, min):  6 hours, 38 min  Percent REM (%):    17.7%   Respiratory Indices:   Calculated pAHI (per hour):  17/hour         REM pAHI:    20.1/hour       NREM pAHI: 16.3/hour  Central pAHI: 0.9/hour  Oxygen Saturation Statistics:    Oxygen Saturation (%) Mean: 96%   Minimum oxygen saturation (%):                 80%   O2 Saturation Range (%): 80 - 100%    O2 Saturation (minutes) <=88%: 0.1 min  Pulse Rate Statistics:   Pulse Mean (bpm):    70/min    Pulse Range (47 - 107/min)   IMPRESSION: OSA (obstructive sleep apnea), moderate  RECOMMENDATION:  This home sleep test demonstrates moderate obstructive sleep apnea with a total AHI of 17/hour and O2 nadir of 80%.  Moderate to loud snoring was noted fairly consistently throughout the night, at times intermittent, at times in the mild range. Treatment with a positive airway pressure (PAP) device is recommended. The patient will be advised to proceed with an autoPAP titration/trial at home for now. A full night titration study may be considered to optimize treatment settings, monitor proper oxygen saturations and aid with improvement of tolerance and adherence, if needed down the road. Alternative treatment options may include a dental device through dentistry or orthodontics in selected patients or Inspire (hypoglossal nerve  stimulator) in carefully selected patients (meeting inclusion criteria).  Concomitant weight loss is recommended. Please note that untreated obstructive sleep apnea may carry additional perioperative morbidity. Patients with significant obstructive sleep apnea should receive perioperative PAP therapy and the surgeons and particularly the anesthesiologist should be informed of the diagnosis and the severity of the sleep disordered breathing. The patient should be cautioned not to drive, work at heights, or operate dangerous or heavy equipment when tired or sleepy. Review and reiteration of good sleep hygiene measures should be pursued with any patient. Other causes of the patient's symptoms, including circadian rhythm disturbances, an underlying mood disorder, medication effect and/or an underlying medical problem cannot be ruled out based on this test. Clinical correlation is recommended.  The patient and her referring provider will be notified of the test results. The patient will be seen in follow up in sleep clinic at [facility].  I certify that I have reviewed the raw data recording prior to the issuance of this report in accordance with the standards of the American Academy of Sleep Medicine (AASM).    INTERPRETING PHYSICIAN:   Star Age, MD, PhD Medical Director, [facility]) [place], ABPN (Neurology and Sleep)   [facility] [address] [place], [place] [address] [phone]  All

## 2023-02-02 NOTE — Addendum Note (Signed)
Addended by: Star Age on: 02/02/2023 04:18 PM   Modules accepted: Orders

## 2023-02-03 ENCOUNTER — Telehealth: Payer: Self-pay

## 2023-02-03 NOTE — Telephone Encounter (Signed)
-----   Message from Star Age, MD sent at 02/02/2023  4:18 PM EDT ----- Patient referred by Dr. Wolfgang Phoenix, seen by me on 12/30/2022, patient had a HST on 01/27/2023.    Please call and notify the patient that the recent home sleep test showed obstructive sleep apnea in the moderate range. I recommend treatment in the form of autoPAP, which means, that we don't have to bring her in for a sleep study with CPAP, but will let her start using a so called autoPAP machine at home, which is a CPAP-like machine with self-adjusting pressures. We will send the order to a local DME company (of her choice, or as per insurance requirement). The DME representative will fit her with a mask, educate her on how to use the machine, how to put the mask on, etc. I have placed an order in the chart. Please send the order, talk to patient, send report to referring MD. We will need a FU in sleep clinic for 10 weeks post-PAP set up, please arrange that with me or one of our NPs. Also reinforce the need for compliance with treatment. Thanks,   Star Age, MD, PhD [facility])

## 2023-02-03 NOTE — Telephone Encounter (Signed)
I called pt. I advised pt that Dr. Rexene Alberts reviewed their sleep study results and found that pt has moderate osa. Dr. Rexene Alberts recommends that pt start an auto-PAP at home. I reviewed PAP compliance expectations with the pt. Pt is agreeable to starting an auto-PAP. I advised pt that an order will be sent to a DME, [facility], and [place] will call the pt within about one week after they file with the pt's insurance. [facility] will show the pt how to use the machine, fit for masks, and troubleshoot the auto-PAP if needed. A follow up appt was made for insurance purposes with Judson Roch, NP on 05/27/23 at 12:45pm. Pt verbalized understanding to arrive 15 minutes early and bring their auto-PAP. Pt verbalized understanding of results. Pt had no questions at this time but was encouraged to call back if questions arise. I have sent the order to [facility] and have received confirmation that they have received the order.

## 2023-03-12 DIAGNOSIS — Z1331 Encounter for screening for depression: Secondary | ICD-10-CM | POA: Diagnosis not present

## 2023-03-12 DIAGNOSIS — F909 Attention-deficit hyperactivity disorder, unspecified type: Secondary | ICD-10-CM | POA: Diagnosis not present

## 2023-03-12 DIAGNOSIS — N644 Mastodynia: Secondary | ICD-10-CM | POA: Diagnosis not present

## 2023-03-12 DIAGNOSIS — R7303 Prediabetes: Secondary | ICD-10-CM | POA: Diagnosis not present

## 2023-03-12 DIAGNOSIS — Z862 Personal history of diseases of the blood and blood-forming organs and certain disorders involving the immune mechanism: Secondary | ICD-10-CM | POA: Diagnosis not present

## 2023-03-12 DIAGNOSIS — Z13220 Encounter for screening for lipoid disorders: Secondary | ICD-10-CM | POA: Diagnosis not present

## 2023-03-12 DIAGNOSIS — F411 Generalized anxiety disorder: Secondary | ICD-10-CM | POA: Diagnosis not present

## 2023-03-24 DIAGNOSIS — Z9189 Other specified personal risk factors, not elsewhere classified: Secondary | ICD-10-CM | POA: Diagnosis not present

## 2023-03-24 DIAGNOSIS — D508 Other iron deficiency anemias: Secondary | ICD-10-CM | POA: Diagnosis not present

## 2023-03-24 DIAGNOSIS — N644 Mastodynia: Secondary | ICD-10-CM | POA: Diagnosis not present

## 2023-03-24 DIAGNOSIS — R7301 Impaired fasting glucose: Secondary | ICD-10-CM | POA: Diagnosis not present

## 2023-04-23 DIAGNOSIS — R7303 Prediabetes: Secondary | ICD-10-CM | POA: Diagnosis not present

## 2023-04-23 DIAGNOSIS — R632 Polyphagia: Secondary | ICD-10-CM | POA: Diagnosis not present

## 2023-04-23 DIAGNOSIS — F909 Attention-deficit hyperactivity disorder, unspecified type: Secondary | ICD-10-CM | POA: Diagnosis not present

## 2023-05-11 ENCOUNTER — Encounter: Payer: Self-pay | Admitting: Family Medicine

## 2023-05-20 DIAGNOSIS — R7303 Prediabetes: Secondary | ICD-10-CM | POA: Diagnosis not present

## 2023-05-20 DIAGNOSIS — F909 Attention-deficit hyperactivity disorder, unspecified type: Secondary | ICD-10-CM | POA: Diagnosis not present

## 2023-05-20 DIAGNOSIS — K5903 Drug induced constipation: Secondary | ICD-10-CM | POA: Diagnosis not present

## 2023-05-27 ENCOUNTER — Encounter: Payer: BC Managed Care – PPO | Admitting: Neurology

## 2023-06-01 DIAGNOSIS — Z01419 Encounter for gynecological examination (general) (routine) without abnormal findings: Secondary | ICD-10-CM | POA: Diagnosis not present

## 2023-06-01 DIAGNOSIS — Z6841 Body Mass Index (BMI) 40.0 and over, adult: Secondary | ICD-10-CM | POA: Diagnosis not present

## 2023-06-02 ENCOUNTER — Other Ambulatory Visit: Payer: Self-pay | Admitting: Family Medicine

## 2023-06-15 DIAGNOSIS — F909 Attention-deficit hyperactivity disorder, unspecified type: Secondary | ICD-10-CM | POA: Diagnosis not present

## 2023-06-15 DIAGNOSIS — R632 Polyphagia: Secondary | ICD-10-CM | POA: Diagnosis not present

## 2023-06-15 DIAGNOSIS — R7303 Prediabetes: Secondary | ICD-10-CM | POA: Diagnosis not present

## 2023-07-02 DIAGNOSIS — N898 Other specified noninflammatory disorders of vagina: Secondary | ICD-10-CM | POA: Diagnosis not present

## 2023-07-02 DIAGNOSIS — L237 Allergic contact dermatitis due to plants, except food: Secondary | ICD-10-CM | POA: Diagnosis not present

## 2023-07-03 DIAGNOSIS — N898 Other specified noninflammatory disorders of vagina: Secondary | ICD-10-CM | POA: Diagnosis not present

## 2023-07-15 DIAGNOSIS — F909 Attention-deficit hyperactivity disorder, unspecified type: Secondary | ICD-10-CM | POA: Diagnosis not present

## 2023-08-17 DIAGNOSIS — Z9189 Other specified personal risk factors, not elsewhere classified: Secondary | ICD-10-CM | POA: Diagnosis not present

## 2023-08-17 DIAGNOSIS — F909 Attention-deficit hyperactivity disorder, unspecified type: Secondary | ICD-10-CM | POA: Diagnosis not present

## 2023-09-14 DIAGNOSIS — Z9189 Other specified personal risk factors, not elsewhere classified: Secondary | ICD-10-CM | POA: Diagnosis not present

## 2023-09-14 DIAGNOSIS — R112 Nausea with vomiting, unspecified: Secondary | ICD-10-CM | POA: Diagnosis not present

## 2023-09-14 DIAGNOSIS — F909 Attention-deficit hyperactivity disorder, unspecified type: Secondary | ICD-10-CM | POA: Diagnosis not present

## 2023-10-12 DIAGNOSIS — F909 Attention-deficit hyperactivity disorder, unspecified type: Secondary | ICD-10-CM | POA: Diagnosis not present

## 2023-10-12 DIAGNOSIS — F419 Anxiety disorder, unspecified: Secondary | ICD-10-CM | POA: Diagnosis not present

## 2023-10-12 DIAGNOSIS — Z9189 Other specified personal risk factors, not elsewhere classified: Secondary | ICD-10-CM | POA: Diagnosis not present

## 2023-10-12 DIAGNOSIS — R632 Polyphagia: Secondary | ICD-10-CM | POA: Diagnosis not present

## 2023-10-25 ENCOUNTER — Ambulatory Visit
Admission: EM | Admit: 2023-10-25 | Discharge: 2023-10-25 | Disposition: A | Discharge status: Home / Self Care | Payer: BC Managed Care – PPO

## 2023-10-25 ENCOUNTER — Encounter: Payer: Self-pay | Admitting: Emergency Medicine

## 2023-10-25 DIAGNOSIS — W540XXA Bitten by dog, initial encounter: Secondary | ICD-10-CM | POA: Diagnosis not present

## 2023-10-25 DIAGNOSIS — S61233A Puncture wound without foreign body of left middle finger without damage to nail, initial encounter: Secondary | ICD-10-CM

## 2023-10-25 DIAGNOSIS — S61232A Puncture wound without foreign body of right middle finger without damage to nail, initial encounter: Secondary | ICD-10-CM | POA: Diagnosis not present

## 2023-10-25 DIAGNOSIS — S61511A Laceration without foreign body of right wrist, initial encounter: Secondary | ICD-10-CM

## 2023-10-25 HISTORY — DX: Type 2 diabetes mellitus without complications: E11.9

## 2023-10-25 MED ORDER — AMOXICILLIN-POT CLAVULANATE 875-125 MG PO TABS: 1.0000 | ORAL_TABLET | Freq: Two times a day (BID) | ORAL | 0 refills | Status: DC

## 2023-10-25 NOTE — ED Provider Notes (Signed)
[facility]    CSN: [id number] Arrival date & time: 10/25/23  1315      History   Chief Complaint No chief complaint on file.   HPI Michelle Lozano is a 37 y.o. female.   The history is provided by the patient.   Patient presents with a dog bite to the right hand and wrist that occurred today when she was trying to break up her 2 dogs.  Patient states that the dogs were fighting over a bone.  Patient reports that her dog's rabies vaccinations are up-to-date.  She reports her last tetanus shot was approximately 6 to 7 years ago.  Patient has a puncture wound/laceration to the right wrist.  She also has injury to the right middle finger and to the left middle finger.  Patient denies fever, chills, chest pain, abdominal pain, nausea, vomiting, diarrhea, or foul-smelling drainage from the sites.  Past Medical History:  Diagnosis Date   Anxiety    Diabetes mellitus without complication (HCC)    Pre-diabetes     Patient Active Problem List   Diagnosis Date Noted   S/P cesarean section: indication: NRFHR and Arrest of dilation 07/24/2017    Past Surgical History:  Procedure Laterality Date   CESAREAN SECTION N/A 07/24/2017   Procedure: CESAREAN SECTION;  Surgeon: Olivia Mackie, MD;  Location: [facility];  Service: Obstetrics;  Laterality: N/A;   CESAREAN SECTION N/A 06/10/2019   Procedure: CESAREAN SECTION;  Surgeon: Marlow Baars, MD;  Location: [facility];  Service: Obstetrics;  Laterality: N/A;   LEEP     TONSILLECTOMY      OB History     Gravida  2   Para  2   Term  2   Preterm      AB      Living  2      SAB      IAB      Ectopic      Multiple  0   Live Births  2            Home Medications    Prior to Admission medications   Medication Sig Start Date End Date Taking? Authorizing Provider  Tirzepatide-Weight Management (ZEPBOUND [place]) Inject into the skin.   Yes [provider]  FLUoxetine (PROZAC) 20 MG capsule  Take 1 capsule (20 mg total) by mouth daily. 09/22/22   Babs Sciara, MD  levonorgestrel (MIRENA, 52 MG,) 20 MCG/24HR IUD Mirena 20 mcg/24 hours (7 yrs) 52 mg intrauterine device  Take 1 device by intrauterine route.    [provider]  lisdexamfetamine (VYVANSE) 50 MG capsule Take 1 capsule (50 mg total) by mouth daily before breakfast. 01/27/23 02/26/23  Babs Sciara, MD    Family History Family History  Problem Relation Age of Onset   Thyroid disease Father    Thyroid disease Sister    Diabetes Maternal Grandfather     Social History Social History   Tobacco Use   Smoking status: Former    Current packs/day: 0.00    Types: Cigarettes    Quit date: 07/25/2007    Years since quitting: 16.2   Smokeless tobacco: Never  Vaping Use   Vaping status: Never Used  Substance Use Topics   Alcohol use: No   Drug use: No     Allergies   Patient has no known allergies.   Review of Systems Review of Systems Per HPI  Physical Exam Triage Vital Signs ED Triage  Vitals  Encounter Vitals Group     BP 10/25/23 1354 125/77     Systolic BP Percentile --      Diastolic BP Percentile --      Pulse Rate 10/25/23 1354 82     Resp 10/25/23 1354 18     Temp 10/25/23 1354 98.4 F (36.9 C)     Temp Source 10/25/23 1354 Oral     SpO2 10/25/23 1354 95 %     Weight --      Height --      Head Circumference --      Peak Flow --      Pain Score 10/25/23 1355 3     Pain Loc --      Pain Education --      Exclude from Growth Chart --    No data found.  Updated Vital Signs BP 125/77 (BP Location: Left Arm)   Pulse 82   Temp 98.4 F (36.9 C) (Oral)   Resp 18   SpO2 95%   Visual Acuity Right Eye Distance:   Left Eye Distance:   Bilateral Distance:    Right Eye Near:   Left Eye Near:    Bilateral Near:     Physical Exam Vitals and nursing note reviewed.  Constitutional:      Appearance: Normal appearance.  HENT:     Head: Normocephalic.  Eyes:     Extraocular  Movements: Extraocular movements intact.     Pupils: Pupils are equal, round, and reactive to light.  Pulmonary:     Effort: Pulmonary effort is normal.  Musculoskeletal:     Cervical back: Normal range of motion.  Skin:    General: Skin is warm and dry.     Findings: Laceration present.     Comments: Skin avulsion around the nailbed of the left middle finger with abrasion noted to the DIP joint of the right middle finger..  Laceration noted to the right wrist.  Laceration measures approximately 0.75 cm in diameter.  Bleeding is controlled at this time.  Neurological:     General: No focal deficit present.     Mental Status: She is alert and oriented to person, place, and time.  Psychiatric:        Mood and Affect: Mood normal.        Behavior: Behavior normal.      UC Treatments / Results  Labs (all labs ordered are listed, but only abnormal results are displayed) Labs Reviewed - No data to display  EKG   Radiology No results found.  Procedures Laceration Repair  Date/Time: 10/25/2023 2:49 PM  Performed by: Abran Cantor, NP Authorized by: Abran Cantor, NP   Consent:    Consent obtained:  Verbal   Consent given by:  Patient   Risks discussed:  Infection and poor cosmetic result   Alternatives discussed:  No treatment Universal protocol:    Procedure explained and questions answered to patient or proxy's satisfaction: yes     Patient identity confirmed:  Verbally with patient Anesthesia:    Anesthesia method:  None Laceration details:    Location: right wrist. Exploration:    Limited defect created (wound extended): yes     Contaminated: no   Treatment:    Wound cleansed with: Sterile water and Hibiclens.   Amount of cleaning:  Standard Skin repair:    Repair method: Derma clips. Approximation:    Approximation:  Close Repair type:    Repair type:  Simple Post-procedure details:    Dressing:  Antibiotic ointment and non-adherent  dressing   Procedure completion:  Tolerated Comments:     Laceration noted to the right wrist.  Laceration measures approximately 0.75 cm in length.  Area was cleansed with sterile water and Hibiclens soap.  2 derma clips were applied with use of benzoin.  Patient tolerated well.  Antibiotic ointment, nonadherent gauze, and Coban were applied.  Skin avulsion noted to the nailbed of the left middle finger.  Site was cleansed with sterile water and Hibiclens soap.  Antibiotic ointment and nonadherent gauze was applied.  Abrasion noted to DIP joint of the right middle finger.  Area was cleansed, antibiotic ointment and bandage applied.  (including critical care time)  Medications Ordered in UC Medications - No data to display  Initial Impression / Assessment and Plan / UC Course  I have reviewed the triage vital signs and the nursing notes.  Pertinent labs & imaging results that were available during my care of the patient were reviewed by me and considered in my medical decision making (see chart for details).  2 Derma clip applied to the right wrist.  Right middle finger and right hand were cleaned with dressing applied to the right middle finger and left middle finger.  Will treat patient with Augmentin 875/125 mg tablets to cover for possible infection.  Patient's tetanus shot is up-to-date along with her dogs rabies vaccines per the patient's report.  Supportive care recommendations were provided and discussed with the patient to include over-the-counter analgesics, ice to help with pain or swelling, along with discussing signs and symptoms of infection.  Patient was in agreement with this plan of care and verbalizes understanding.  All questions were answered.  Patient stable for discharge.  Final Clinical Impressions(s) / UC Diagnoses   Final diagnoses:  None   Discharge Instructions   None    ED Prescriptions   None    PDMP not reviewed this encounter.   Abran Cantor,  NP 10/25/23 1454

## 2023-10-25 NOTE — ED Notes (Signed)
Fish farm manager here to take report from patient

## 2023-10-25 NOTE — ED Notes (Signed)
Applied mupirocin ointment to back of patients right hand over derma clips and a non adherent pad with coban. Applied mupirocin ointment to right middle finger and left middle finger.

## 2023-10-25 NOTE — ED Triage Notes (Signed)
Dog bite to right wrist and fingers.  Bite to left middle finger.   States dog was her dog and is up to date on vaccines.  She was trying to pull dogs apart from fighting.   States last tetanus was 7 years ago.

## 2023-10-25 NOTE — Discharge Instructions (Addendum)
Keep the dressing in place for 24 hours. Remove the dressing and clean the area with warm water in 24 hours.  When you are at home, may leave the area open to air.  When you are out, recommend keeping the area covered. May apply Neosporin to the lacerations as needed. May take over-the-counter ibuprofen or Tylenol for pain or discomfort. Follow-up immediately if you develop swelling, increased redness that goes into the hand or up the arm, drainage, or if you develop fever, chills, or other concerns. The derma clips should fall off on their own. Follow-up as needed.

## 2023-11-16 ENCOUNTER — Other Ambulatory Visit (HOSPITAL_COMMUNITY): Payer: Self-pay

## 2023-11-16 ENCOUNTER — Other Ambulatory Visit (HOSPITAL_BASED_OUTPATIENT_CLINIC_OR_DEPARTMENT_OTHER): Payer: Self-pay

## 2023-11-16 DIAGNOSIS — R632 Polyphagia: Secondary | ICD-10-CM | POA: Diagnosis not present

## 2023-11-16 DIAGNOSIS — Z9189 Other specified personal risk factors, not elsewhere classified: Secondary | ICD-10-CM | POA: Diagnosis not present

## 2023-11-16 DIAGNOSIS — F411 Generalized anxiety disorder: Secondary | ICD-10-CM | POA: Diagnosis not present

## 2023-11-16 DIAGNOSIS — F909 Attention-deficit hyperactivity disorder, unspecified type: Secondary | ICD-10-CM | POA: Diagnosis not present

## 2023-11-16 MED ORDER — ZEPBOUND 7.5 MG/0.5ML ~~LOC~~ SOAJ
7.5000 mg | SUBCUTANEOUS | 0 refills | Status: DC
  Filled 2023-11-16: qty 2, 28d supply, fill #0
  Filled 2023-12-09: qty 6, 84d supply, fill #0

## 2023-11-20 ENCOUNTER — Other Ambulatory Visit (HOSPITAL_COMMUNITY): Payer: Self-pay

## 2023-11-23 ENCOUNTER — Other Ambulatory Visit (HOSPITAL_COMMUNITY): Payer: Self-pay

## 2023-11-23 MED ORDER — AMPHETAMINE-DEXTROAMPHET ER 20 MG PO CP24
20.0000 mg | ORAL_CAPSULE | Freq: Every day | ORAL | 0 refills | Status: DC
  Filled 2023-11-23: qty 30, 30d supply, fill #0

## 2023-11-25 ENCOUNTER — Other Ambulatory Visit (HOSPITAL_COMMUNITY): Payer: Self-pay

## 2023-12-08 ENCOUNTER — Other Ambulatory Visit (HOSPITAL_COMMUNITY): Payer: Self-pay

## 2023-12-08 ENCOUNTER — Encounter (HOSPITAL_COMMUNITY): Payer: Self-pay

## 2023-12-09 ENCOUNTER — Other Ambulatory Visit (HOSPITAL_COMMUNITY): Payer: Self-pay

## 2024-01-04 ENCOUNTER — Other Ambulatory Visit (HOSPITAL_COMMUNITY): Payer: Self-pay

## 2024-01-15 ENCOUNTER — Other Ambulatory Visit (HOSPITAL_COMMUNITY): Payer: Self-pay

## 2024-01-15 ENCOUNTER — Other Ambulatory Visit: Payer: Self-pay

## 2024-01-15 DIAGNOSIS — R7303 Prediabetes: Secondary | ICD-10-CM | POA: Diagnosis not present

## 2024-01-15 DIAGNOSIS — F909 Attention-deficit hyperactivity disorder, unspecified type: Secondary | ICD-10-CM | POA: Diagnosis not present

## 2024-01-15 DIAGNOSIS — R632 Polyphagia: Secondary | ICD-10-CM | POA: Diagnosis not present

## 2024-01-15 DIAGNOSIS — F411 Generalized anxiety disorder: Secondary | ICD-10-CM | POA: Diagnosis not present

## 2024-01-15 MED ORDER — FLUOXETINE HCL 20 MG PO CAPS
20.0000 mg | ORAL_CAPSULE | Freq: Every day | ORAL | 2 refills | Status: DC
  Filled 2024-01-15: qty 90, 90d supply, fill #0

## 2024-01-21 ENCOUNTER — Other Ambulatory Visit (HOSPITAL_COMMUNITY): Payer: Self-pay

## 2024-01-22 ENCOUNTER — Other Ambulatory Visit (HOSPITAL_COMMUNITY): Payer: Self-pay

## 2024-01-22 MED ORDER — AMPHETAMINE-DEXTROAMPHET ER 20 MG PO CP24
20.0000 mg | ORAL_CAPSULE | Freq: Every day | ORAL | 0 refills | Status: DC
  Filled 2024-01-22 – 2024-02-11 (×4): qty 30, 30d supply, fill #0

## 2024-01-27 ENCOUNTER — Other Ambulatory Visit: Payer: Self-pay

## 2024-02-05 ENCOUNTER — Other Ambulatory Visit: Payer: Self-pay

## 2024-02-05 ENCOUNTER — Other Ambulatory Visit (HOSPITAL_COMMUNITY): Payer: Self-pay

## 2024-02-11 ENCOUNTER — Other Ambulatory Visit (HOSPITAL_COMMUNITY): Payer: Self-pay

## 2024-02-11 ENCOUNTER — Other Ambulatory Visit: Payer: Self-pay

## 2024-02-16 ENCOUNTER — Other Ambulatory Visit (HOSPITAL_COMMUNITY): Payer: Self-pay

## 2024-02-18 DIAGNOSIS — F909 Attention-deficit hyperactivity disorder, unspecified type: Secondary | ICD-10-CM | POA: Diagnosis not present

## 2024-02-18 DIAGNOSIS — G4733 Obstructive sleep apnea (adult) (pediatric): Secondary | ICD-10-CM | POA: Diagnosis not present

## 2024-02-18 DIAGNOSIS — R7303 Prediabetes: Secondary | ICD-10-CM | POA: Diagnosis not present

## 2024-03-18 DIAGNOSIS — F411 Generalized anxiety disorder: Secondary | ICD-10-CM | POA: Diagnosis not present

## 2024-03-18 DIAGNOSIS — F909 Attention-deficit hyperactivity disorder, unspecified type: Secondary | ICD-10-CM | POA: Diagnosis not present

## 2024-03-18 DIAGNOSIS — R7303 Prediabetes: Secondary | ICD-10-CM | POA: Diagnosis not present

## 2024-03-18 DIAGNOSIS — R632 Polyphagia: Secondary | ICD-10-CM | POA: Diagnosis not present

## 2024-03-21 ENCOUNTER — Other Ambulatory Visit (HOSPITAL_COMMUNITY): Payer: Self-pay

## 2024-03-21 ENCOUNTER — Encounter: Payer: Self-pay | Admitting: Family Medicine

## 2024-03-23 ENCOUNTER — Other Ambulatory Visit (HOSPITAL_COMMUNITY): Payer: Self-pay

## 2024-03-23 ENCOUNTER — Other Ambulatory Visit: Payer: Self-pay | Admitting: Family Medicine

## 2024-03-23 MED ORDER — ALPRAZOLAM 0.5 MG PO TABS: ORAL_TABLET | ORAL | 0 refills | Status: DC

## 2024-03-23 MED ORDER — AMPHETAMINE-DEXTROAMPHET ER 20 MG PO CP24
20.0000 mg | ORAL_CAPSULE | Freq: Every day | ORAL | 0 refills | Status: DC
  Filled 2024-03-23: qty 30, 30d supply, fill #0

## 2024-04-15 DIAGNOSIS — F909 Attention-deficit hyperactivity disorder, unspecified type: Secondary | ICD-10-CM | POA: Diagnosis not present

## 2024-04-15 DIAGNOSIS — G43809 Other migraine, not intractable, without status migrainosus: Secondary | ICD-10-CM | POA: Diagnosis not present

## 2024-04-15 DIAGNOSIS — R7303 Prediabetes: Secondary | ICD-10-CM | POA: Diagnosis not present

## 2024-04-15 DIAGNOSIS — R632 Polyphagia: Secondary | ICD-10-CM | POA: Diagnosis not present

## 2024-04-18 ENCOUNTER — Other Ambulatory Visit (HOSPITAL_COMMUNITY): Payer: Self-pay

## 2024-04-18 MED ORDER — AMPHETAMINE-DEXTROAMPHET ER 20 MG PO CP24
20.0000 mg | ORAL_CAPSULE | Freq: Every morning | ORAL | 0 refills | Status: DC
  Filled 2024-04-18 – 2024-04-21 (×2): qty 30, 30d supply, fill #0

## 2024-04-18 MED ORDER — QULIPTA 60 MG PO TABS
60.0000 mg | ORAL_TABLET | Freq: Every day | ORAL | 3 refills | Status: DC
  Filled 2024-04-18: qty 30, 30d supply, fill #0

## 2024-04-21 ENCOUNTER — Other Ambulatory Visit (HOSPITAL_COMMUNITY): Payer: Self-pay

## 2024-04-29 ENCOUNTER — Other Ambulatory Visit (HOSPITAL_COMMUNITY): Payer: Self-pay

## 2024-05-02 ENCOUNTER — Other Ambulatory Visit (HOSPITAL_COMMUNITY): Payer: Self-pay

## 2024-05-13 DIAGNOSIS — R7303 Prediabetes: Secondary | ICD-10-CM | POA: Diagnosis not present

## 2024-05-13 DIAGNOSIS — R632 Polyphagia: Secondary | ICD-10-CM | POA: Diagnosis not present

## 2024-05-13 DIAGNOSIS — F909 Attention-deficit hyperactivity disorder, unspecified type: Secondary | ICD-10-CM | POA: Diagnosis not present

## 2024-05-13 DIAGNOSIS — G4733 Obstructive sleep apnea (adult) (pediatric): Secondary | ICD-10-CM | POA: Diagnosis not present

## 2024-06-21 DIAGNOSIS — F909 Attention-deficit hyperactivity disorder, unspecified type: Secondary | ICD-10-CM | POA: Diagnosis not present

## 2024-06-21 DIAGNOSIS — R632 Polyphagia: Secondary | ICD-10-CM | POA: Diagnosis not present

## 2024-06-21 DIAGNOSIS — Z6841 Body Mass Index (BMI) 40.0 and over, adult: Secondary | ICD-10-CM | POA: Diagnosis not present

## 2024-06-21 DIAGNOSIS — R7301 Impaired fasting glucose: Secondary | ICD-10-CM | POA: Diagnosis not present

## 2024-08-17 ENCOUNTER — Ambulatory Visit: Admitting: Plastic Surgery

## 2024-08-17 ENCOUNTER — Encounter: Payer: Self-pay | Admitting: Plastic Surgery

## 2024-08-17 VITALS — BP 114/71 | HR 97 | Ht 67.0 in | Wt 270.4 lb

## 2024-08-17 DIAGNOSIS — Z6839 Body mass index (BMI) 39.0-39.9, adult: Secondary | ICD-10-CM | POA: Diagnosis not present

## 2024-08-17 DIAGNOSIS — M546 Pain in thoracic spine: Secondary | ICD-10-CM | POA: Diagnosis not present

## 2024-08-17 DIAGNOSIS — G8929 Other chronic pain: Secondary | ICD-10-CM

## 2024-08-17 DIAGNOSIS — M542 Cervicalgia: Secondary | ICD-10-CM | POA: Insufficient documentation

## 2024-08-17 DIAGNOSIS — M549 Dorsalgia, unspecified: Secondary | ICD-10-CM | POA: Insufficient documentation

## 2024-08-17 DIAGNOSIS — N62 Hypertrophy of breast: Secondary | ICD-10-CM | POA: Diagnosis not present

## 2024-08-17 NOTE — Telephone Encounter (Signed)
 Copied from CRM [id number]. Topic: Appointments - Transfer of Care >> Aug 12, 2024 12:24 PM Suzen RAMAN wrote: Pt is requesting to transfer FROM: Glendia Luking Pt is requesting to transfer TO: Geni Shutter Reason for requested transfer: Previous patient of Dr. Shutter at another facility It is the responsibility of the team the patient would like to transfer to (Dr. Shutter) to reach out to the patient if for any reason this transfer is not acceptable.

## 2024-08-17 NOTE — Progress Notes (Signed)
 Patient ID: Michelle Lozano, female    DOB: [birth year], 38 y.o.   MRN: [id number]   Chief Complaint  Patient presents with   consult    Mammary Hyperplasia: The patient is a 38 y.o. female with a history of mammary hyperplasia for several years.  She has extremely large breasts causing symptoms that include the following: Back pain in the upper and lower back, including neck pain. She pulls or pins her bra straps to provide better lift and relief of the pressure and pain. She notices relief by holding her breast up manually.  Her shoulder straps cause grooves and pain and pressure that requires padding for relief. Pain medication is sometimes required with motrin  and tylenol .  Activities that are hindered by enlarged breasts include: exercise and running.  She has tried supportive clothing as well as fitted bras without improvement.  Her breasts are extremely large and fairly symmetric.  She has hyperpigmentation of the inframammary area on both sides.  The sternal to nipple distance on the right is 38 cm and the left is 43 cm.  The IMF distance is 17 cm.  She is 5 feet 7 inches tall and weighs 254 pounds.  The BMI = 39.8 kg/m.  Preoperative bra size = K cup.  She would like to be a C or D cup.  The estimated excess breast tissue to be removed at the time of surgery = 850-900 grams on the left and 850-900 grams on the right.  Mammogram history: None.  Family history of breast cancer: None.  Tobacco use: Quit over 15 years ago.   The patient expresses the desire to pursue surgical intervention.  The patient has 2 children at home a 69-year-old and a 12-year-old.  She had a tonsillectomy and her wisdom teeth out without any difficulty.     Review of Systems  Constitutional:  Positive for activity change. Negative for appetite change.  HENT: Negative.    Eyes: Negative.   Respiratory: Negative.    Cardiovascular: Negative.   Gastrointestinal: Negative.   Endocrine: Negative.    Genitourinary: Negative.   Musculoskeletal:  Positive for back pain and neck pain.  Skin:  Positive for rash.    Past Medical History:  Diagnosis Date   Anxiety    Diabetes mellitus without complication (HCC)    Pre-diabetes     Past Surgical History:  Procedure Laterality Date   CESAREAN SECTION N/A 07/24/2017   Procedure: CESAREAN SECTION;  Surgeon: Gorge Ade, MD;  Location: [facility];  Service: Obstetrics;  Laterality: N/A;   CESAREAN SECTION N/A 06/10/2019   Procedure: CESAREAN SECTION;  Surgeon: Gretta Gums, MD;  Location: [facility];  Service: Obstetrics;  Laterality: N/A;   LEEP     TONSILLECTOMY        Current Outpatient Medications:    ALPRAZolam  (XANAX ) 0.5 MG tablet, 1 tablet nightly as needed insomnia caution drowsiness, Disp: 4 tablet, Rfl: 0   amoxicillin -clavulanate (AUGMENTIN ) 875-125 MG tablet, Take 1 tablet by mouth every 12 (twelve) hours., Disp: 14 tablet, Rfl: 0   amphetamine -dextroamphetamine  (ADDERALL XR) 20 MG 24 hr capsule, Take 1 capsule (20 mg total) by mouth in the morning., Disp: 30 capsule, Rfl: 0   Atogepant  (QULIPTA ) 60 MG TABS, Take 1 tablet (60 mg total) by mouth daily., Disp: 30 tablet, Rfl: 3   FLUoxetine  (PROZAC ) 20 MG capsule, Take 1 capsule (20 mg total) by mouth daily., Disp: 90 capsule, Rfl: 1   FLUoxetine  (PROZAC ) 20  MG capsule, Take 1 capsule (20 mg total) by mouth daily., Disp: 90 capsule, Rfl: 2   levonorgestrel (MIRENA, 52 MG,) 20 MCG/24HR IUD, Mirena 20 mcg/24 hours (7 yrs) 52 mg intrauterine device  Take 1 device by intrauterine route., Disp: , Rfl:    lisdexamfetamine (VYVANSE ) 50 MG capsule, Take 1 capsule (50 mg total) by mouth daily before breakfast., Disp: 30 capsule, Rfl: 0   tirzepatide  (ZEPBOUND ) 7.5 MG/0.5ML Pen, Inject 7.5 mg into the skin once a week., Disp: 6 mL, Rfl: 0   Tirzepatide -Weight Management (ZEPBOUND  [place]), Inject into the skin., Disp: , Rfl:    Objective:   There were no vitals filed for this  visit.  Physical Exam Vitals and nursing note reviewed.  Constitutional:      Appearance: Normal appearance.  HENT:     Head: Normocephalic and atraumatic.  Cardiovascular:     Rate and Rhythm: Normal rate.     Pulses: Normal pulses.  Pulmonary:     Effort: Pulmonary effort is normal.  Abdominal:     General: There is no distension.     Palpations: Abdomen is soft.     Tenderness: There is no abdominal tenderness.  Skin:    General: Skin is warm.     Capillary Refill: Capillary refill takes less than 2 seconds.  Neurological:     Mental Status: She is alert and oriented to person, place, and time.  Psychiatric:        Mood and Affect: Mood normal.        Behavior: Behavior normal.        Thought Content: Thought content normal.        Judgment: Judgment normal.     Assessment & Plan:  Symptomatic mammary hypertrophy  Chronic bilateral thoracic back pain  Neck pain  The procedure the patient selected and that was best for the patient was discussed. The risk were discussed and include but not limited to the following:  Breast asymmetry, fluid accumulation, firmness of the breast, inability to breast feed, loss of nipple or areola, skin loss, change in skin and nipple sensation, fat necrosis of the breast tissue, bleeding, infection and healing delay.  There are risks of anesthesia and injury to nerves or blood vessels.  Allergic reaction to tape, suture and skin glue are possible.  There will be swelling.  Any of these can lead to the need for revisional surgery which is not included in this surgery.  A breast reduction has potential to interfere with diagnostic procedures in the future.  This procedure is best done when the breast is fully developed.  Changes in the breast will continue to occur over time: pregnancy, weight gain or weigh loss. No guarantees are given for a certain bra or breast size.    Total time: 40 minutes. This includes time spent with the patient during the  visit as well as time spent before and after the visit reviewing the chart, documenting the encounter, ordering pertinent studies and literature for the patient.   Physical therapy:  no required Mammogram:  not required  The patient is a good candidate for bilateral breast reduction with liposuction.  She may need the amputation technique also described as a skin graft technique.  Pictures were obtained of the patient and placed in the chart with the patient's or guardian's permission.   Estefana RAMAN Kendon Sedeno, DO

## 2024-10-18 ENCOUNTER — Institutional Professional Consult (permissible substitution): Admitting: Plastic Surgery

## 2024-11-07 ENCOUNTER — Ambulatory Visit: Admitting: Family Medicine

## 2024-11-07 VITALS — BP 126/82 | HR 91 | Ht 67.0 in | Wt 271.0 lb

## 2024-11-07 DIAGNOSIS — N62 Hypertrophy of breast: Secondary | ICD-10-CM | POA: Diagnosis not present

## 2024-11-07 DIAGNOSIS — E559 Vitamin D deficiency, unspecified: Secondary | ICD-10-CM

## 2024-11-07 DIAGNOSIS — Z23 Encounter for immunization: Secondary | ICD-10-CM

## 2024-11-07 DIAGNOSIS — R7303 Prediabetes: Secondary | ICD-10-CM

## 2024-11-07 DIAGNOSIS — E66813 Obesity, class 3: Secondary | ICD-10-CM | POA: Diagnosis not present

## 2024-11-07 DIAGNOSIS — Z79899 Other long term (current) drug therapy: Secondary | ICD-10-CM

## 2024-11-07 DIAGNOSIS — Z6841 Body Mass Index (BMI) 40.0 and over, adult: Secondary | ICD-10-CM

## 2024-11-07 DIAGNOSIS — F900 Attention-deficit hyperactivity disorder, predominantly inattentive type: Secondary | ICD-10-CM

## 2024-11-07 DIAGNOSIS — R632 Polyphagia: Secondary | ICD-10-CM | POA: Diagnosis not present

## 2024-11-07 DIAGNOSIS — R635 Abnormal weight gain: Secondary | ICD-10-CM

## 2024-11-07 LAB — COMPREHENSIVE METABOLIC PANEL WITH GFR
ALT: 16 U/L (ref 3–35)
AST: 20 U/L (ref 5–37)
Albumin: 4.5 g/dL (ref 3.5–5.2)
Alkaline Phosphatase: 82 U/L (ref 39–117)
BUN: 12 mg/dL (ref 6–23)
CO2: 27 meq/L (ref 19–32)
Calcium: 9.1 mg/dL (ref 8.4–10.5)
Chloride: 105 meq/L (ref 96–112)
Creatinine, Ser: 0.76 mg/dL (ref 0.40–1.20)
GFR: 99.85 mL/min
Glucose, Bld: 82 mg/dL (ref 70–99)
Potassium: 4.1 meq/L (ref 3.5–5.1)
Sodium: 139 meq/L (ref 135–145)
Total Bilirubin: 0.3 mg/dL (ref 0.2–1.2)
Total Protein: 6.9 g/dL (ref 6.0–8.3)

## 2024-11-07 LAB — CBC WITH DIFFERENTIAL/PLATELET
Basophils Absolute: 0.1 K/uL (ref 0.0–0.1)
Basophils Relative: 0.8 % (ref 0.0–3.0)
Eosinophils Absolute: 0.1 K/uL (ref 0.0–0.7)
Eosinophils Relative: 1 % (ref 0.0–5.0)
HCT: 38.3 % (ref 36.0–46.0)
Hemoglobin: 12.6 g/dL (ref 12.0–15.0)
Lymphocytes Relative: 26.8 % (ref 12.0–46.0)
Lymphs Abs: 2.5 K/uL (ref 0.7–4.0)
MCHC: 33 g/dL (ref 30.0–36.0)
MCV: 84.4 fl (ref 78.0–100.0)
Monocytes Absolute: 0.6 K/uL (ref 0.1–1.0)
Monocytes Relative: 6.4 % (ref 3.0–12.0)
Neutro Abs: 6.2 K/uL (ref 1.4–7.7)
Neutrophils Relative %: 65 % (ref 43.0–77.0)
Platelets: 376 K/uL (ref 150.0–400.0)
RBC: 4.54 Mil/uL (ref 3.87–5.11)
RDW: 16.4 % — ABNORMAL HIGH (ref 11.5–15.5)
WBC: 9.5 K/uL (ref 4.0–10.5)

## 2024-11-07 LAB — LIPID PANEL
Cholesterol: 161 mg/dL (ref 28–200)
HDL: 72.9 mg/dL
LDL Cholesterol: 71 mg/dL (ref 10–99)
NonHDL: 87.65
Total CHOL/HDL Ratio: 2
Triglycerides: 84 mg/dL (ref 10.0–149.0)
VLDL: 16.8 mg/dL (ref 0.0–40.0)

## 2024-11-07 LAB — HEMOGLOBIN A1C: Hgb A1c MFr Bld: 5.3 % (ref 4.6–6.5)

## 2024-11-07 LAB — VITAMIN B12: Vitamin B-12: 381 pg/mL (ref 211–911)

## 2024-11-07 LAB — VITAMIN D 25 HYDROXY (VIT D DEFICIENCY, FRACTURES): VITD: 32.55 ng/mL (ref 30.00–100.00)

## 2024-11-07 LAB — TSH: TSH: 1.45 u[IU]/mL (ref 0.35–5.50)

## 2024-11-07 NOTE — Progress Notes (Signed)
 "   Patient Care Team: Prentiss Frieze, DO as PCP - General (Family Medicine)  Subjective:   History of Present Illness Michelle Lozano is a 38 year old female who presents for medication refills and evaluation of weight gain and chest congestion.  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  Medication management - Taking Adderall and fluoxetine  regularly - Adderall helps limit snacking - Stopped topiramate  due to lack of benefit - Requests refills for Adderall and fluoxetine   Prediabetes and sleep apnea - History of prediabetes - History of mild sleep apnea - No laboratory testing in over a year; requests updated blood work  Alcohol use and hepatic concerns - Increased alcohol consumption - Concerned about effects of alcohol use on health - Never had a liver ultrasound  Chest congestion and cough - Persistent productive cough and chest congestion for approximately one month - Symptoms began after viral illness in early November - No wheezing or reflux symptoms  Macromastia-related symptoms - Neck and shoulder pain - Bra strap grooving - Rashes under breasts - Symptoms related to large breasts - Insurance previously denied breast reduction as not medically necessary  Review of Systems: Negative, with the exception of above mentioned in HPI.  History:   Reviewed by clinician on day of visit: allergies, medications, problem list, medical history, surgical history, family history, social history, and previous encounter notes.  Medications:   Show/hide medication list[1] Allergies[2]  Objective:   BP 126/82 (BP Location: Right Arm, Cuff Size: Large)   Pulse 91   Ht 5' 7 (1.702 m)   Wt 271 lb  (122.9 kg)   SpO2 99%   BMI 42.44 kg/m   Physical Exam Constitutional:      General: She is not in acute distress.    Appearance: She is well-developed.  HENT:     Head: Normocephalic and atraumatic.  Eyes:     Conjunctiva/sclera: Conjunctivae normal.  Cardiovascular:     Rate and Rhythm: Normal rate and regular rhythm.     Heart sounds: Normal heart sounds.  Pulmonary:     Effort: Pulmonary effort is normal.     Breath sounds: Normal breath sounds.  Musculoskeletal:     Comments: Forward posture, grooves in shoulders due to bra strap, muscle spasms thoracic and cervical paraspinal mm and traps.  Neurological:     General: No focal deficit present.     Mental Status: She is alert.  Psychiatric:        Behavior: Behavior normal.    Results for orders placed or performed in visit on 11/07/24  CBC with Differential/Platelet   Collection Time: 11/07/24  2:25 PM  Result Value Ref Range   WBC 9.5 4.0 - 10.5 K/uL   RBC 4.54 3.87 - 5.11 Mil/uL   Hemoglobin 12.6 12.0 - 15.0 g/dL   HCT 61.6 63.9 - 53.9 %   MCV 84.4 78.0 - 100.0 fl   MCHC 33.0 30.0 - 36.0 g/dL   RDW 83.5 (H) 88.4 - 84.4 %   Platelets 376.0 150.0 - 400.0 K/uL   Neutrophils Relative % 65.0 43.0 - 77.0 %   Lymphocytes Relative 26.8 12.0 - 46.0 %   Monocytes Relative 6.4 3.0 - 12.0 %   Eosinophils Relative 1.0 0.0 - 5.0 %  Basophils Relative 0.8 0.0 - 3.0 %   Neutro Abs 6.2 1.4 - 7.7 K/uL   Lymphs Abs 2.5 0.7 - 4.0 K/uL   Monocytes Absolute 0.6 0.1 - 1.0 K/uL   Eosinophils Absolute 0.1 0.0 - 0.7 K/uL   Basophils Absolute 0.1 0.0 - 0.1 K/uL  Comprehensive metabolic panel with GFR   Collection Time: 11/07/24  2:25 PM  Result Value Ref Range   Sodium 139 135 - 145 mEq/L   Potassium 4.1 3.5 - 5.1 mEq/L   Chloride 105 96 - 112 mEq/L   CO2 27 19 - 32 mEq/L   Glucose, Bld 82 70 - 99 mg/dL   BUN 12 6 - 23 mg/dL   Creatinine, Ser 9.23 0.40 - 1.20 mg/dL   Total Bilirubin 0.3 0.2 - 1.2 mg/dL   Alkaline Phosphatase  82 39 - 117 U/L   AST 20 5 - 37 U/L   ALT 16 3 - 35 U/L   Total Protein 6.9 6.0 - 8.3 g/dL   Albumin 4.5 3.5 - 5.2 g/dL   GFR 00.14 >39.99 mL/min   Calcium 9.1 8.4 - 10.5 mg/dL  Hemoglobin J8r   Collection Time: 11/07/24  2:25 PM  Result Value Ref Range   Hgb A1c MFr Bld 5.3 4.6 - 6.5 %  Lipid panel   Collection Time: 11/07/24  2:25 PM  Result Value Ref Range   Cholesterol 161 28 - 200 mg/dL   Triglycerides 15.9 89.9 - 149.0 mg/dL   HDL 27.09 >60.99 mg/dL   VLDL 83.1 0.0 - 59.9 mg/dL   LDL Cholesterol 71 10 - 99 mg/dL   Total CHOL/HDL Ratio 2    NonHDL 87.65   TSH   Collection Time: 11/07/24  2:25 PM  Result Value Ref Range   TSH 1.45 0.35 - 5.50 uIU/mL  VITAMIN D  25 Hydroxy (Vit-D Deficiency, Fractures)   Collection Time: 11/07/24  2:25 PM  Result Value Ref Range   VITD 32.55 30.00 - 100.00 ng/mL  Vitamin B12   Collection Time: 11/07/24  2:25 PM  Result Value Ref Range   Vitamin B-12 381 211 - 911 pg/mL    Diagnoses and Orders:   1. Prediabetes   2. Polyphagia   3. Macromastia   4. ADHD (attention deficit hyperactivity disorder), inattentive type   5. Medication management   6. Vitamin D  deficiency   7. Weight gain   8. Immunization due   9. Class 3 severe obesity without serious comorbidity with body mass index (BMI) of 40.0 to 44.9 in adult, unspecified obesity type (HCC)    Orders Placed This Encounter  Procedures   Flu vaccine trivalent PF, 6mos and older(Flulaval,Afluria,Fluarix,Fluzone)   CBC with Differential/Platelet   Comprehensive metabolic panel with GFR   Hemoglobin A1c   Lipid panel   TSH   VITAMIN D  25 Hydroxy (Vit-D Deficiency, Fractures)   Vitamin B12   Assessment & Plan:   Assessment and Plan Assessment & Plan Class 3 obesity Recent weight gain of 20 pounds. Previous weight loss with medication. Sedentary lifestyle and increased alcohol consumption noted. - Ordered labs to assess current metabolic status. - Discussed potential use of  Zepbound  for weight management. - Encouraged joining a gym and engaging in regular physical activity.  Prediabetes Potential worsening due to weight gain and increased alcohol consumption. - Ordered labs to assess current glucose levels and assess for diabetes.  Attention-deficit hyperactivity disorder, inattentive type Managed with Adderall, which helps with snacking habits. - Refilled Adderall prescription.  Macromastia with musculoskeletal symptoms Neck, shoulder, and lower back pain. Previous insurance denial for breast reduction. - Wrote a letter to insurance to dispute previous decision and support medical necessity of breast reduction.  Post-viral cough with otitis media Persistent cough with mucus production following viral illness. Possible post-bronchitis cough. - Advised to send a MyChart message if symptoms worsen for potential antibiotic prescription.  General health maintenance Discussion of flu vaccination. Declined COVID vaccine but agreed to flu shot. - Administered flu shot.  Geni Shutter, DO, MS, FAAFP, Dipl. [facility] [address] [place], [address] Dept: [phone] Dept Fax: [phone]  Attestations:   Chart updated today with reconciliation of problem list, medications, allergies, and relevant history. Preventive care and chronic disease status reviewed.  Portions of historical chart may remain incomplete; will update on an ongoing basis as clinically indicated.  Discussed the use of AI scribe software for clinical note transcription with the patient, who gave verbal consent to proceed.     [1]  Outpatient Medications Prior to Visit  Medication Sig   amphetamine -dextroamphetamine  (ADDERALL XR) 20 MG 24 hr capsule Take 1 capsule (20 mg total) by mouth in the morning.   FLUoxetine  (PROZAC ) 20 MG capsule Take 1 capsule (20 mg total) by mouth daily.   levonorgestrel (MIRENA, 52 MG,) 20 MCG/24HR IUD  Mirena 20 mcg/24 hours (7 yrs) 52 mg intrauterine device  Take 1 device by intrauterine route.   [DISCONTINUED] ALPRAZolam  (XANAX ) 0.5 MG tablet 1 tablet nightly as needed insomnia caution drowsiness   [DISCONTINUED] Atogepant  (QULIPTA ) 60 MG TABS Take 1 tablet (60 mg total) by mouth daily.   [DISCONTINUED] amoxicillin -clavulanate (AUGMENTIN ) 875-125 MG tablet Take 1 tablet by mouth every 12 (twelve) hours.   [DISCONTINUED] FLUoxetine  (PROZAC ) 20 MG capsule Take 1 capsule (20 mg total) by mouth daily.   [DISCONTINUED] lisdexamfetamine  (VYVANSE ) 50 MG capsule Take 1 capsule (50 mg total) by mouth daily before breakfast.   [DISCONTINUED] tirzepatide  (ZEPBOUND ) 7.5 MG/0.5ML Pen Inject 7.5 mg into the skin once a week.   [DISCONTINUED] Tirzepatide -Weight Management (ZEPBOUND  [place]) Inject into the skin.   No facility-administered medications prior to visit.  [2] No Known Allergies  "

## 2024-11-15 ENCOUNTER — Other Ambulatory Visit (HOSPITAL_COMMUNITY): Payer: Self-pay

## 2024-11-15 ENCOUNTER — Encounter: Payer: Self-pay | Admitting: Family Medicine

## 2024-11-15 DIAGNOSIS — R632 Polyphagia: Secondary | ICD-10-CM | POA: Insufficient documentation

## 2024-11-15 DIAGNOSIS — F909 Attention-deficit hyperactivity disorder, unspecified type: Secondary | ICD-10-CM | POA: Insufficient documentation

## 2024-11-15 DIAGNOSIS — G4733 Obstructive sleep apnea (adult) (pediatric): Secondary | ICD-10-CM | POA: Insufficient documentation

## 2024-11-15 DIAGNOSIS — R7303 Prediabetes: Secondary | ICD-10-CM | POA: Insufficient documentation

## 2024-11-18 MED ORDER — AMPHETAMINE-DEXTROAMPHET ER 20 MG PO CP24: 20.0000 mg | ORAL_CAPSULE | Freq: Every morning | ORAL | 0 refills | Status: AC

## 2024-12-19 ENCOUNTER — Ambulatory Visit: Admitting: Family Medicine

## 2024-12-26 ENCOUNTER — Ambulatory Visit: Admitting: Family Medicine
